# Patient Record
Sex: Female | Born: 1985 | Race: White | Hispanic: No | Marital: Married | Smoking: Former smoker
Health system: Southern US, Community
[De-identification: ages and names within clinical notes are randomized; demographics above are authoritative.]

## PROBLEM LIST (undated history)

## (undated) DIAGNOSIS — E7800 Pure hypercholesterolemia, unspecified: Secondary | ICD-10-CM

## (undated) DIAGNOSIS — I10 Essential (primary) hypertension: Secondary | ICD-10-CM

## (undated) DIAGNOSIS — D249 Benign neoplasm of unspecified breast: Secondary | ICD-10-CM

## (undated) DIAGNOSIS — E559 Vitamin D deficiency, unspecified: Secondary | ICD-10-CM

## (undated) DIAGNOSIS — M419 Scoliosis, unspecified: Secondary | ICD-10-CM

## (undated) DIAGNOSIS — N946 Dysmenorrhea, unspecified: Secondary | ICD-10-CM

## (undated) DIAGNOSIS — O1490 Unspecified pre-eclampsia, unspecified trimester: Secondary | ICD-10-CM

## (undated) HISTORY — DX: Pure hypercholesterolemia, unspecified: E78.00

## (undated) HISTORY — DX: Dysmenorrhea, unspecified: N94.6

## (undated) HISTORY — PX: MOUTH SURGERY: SHX715

## (undated) HISTORY — DX: Vitamin D deficiency, unspecified: E55.9

## (undated) HISTORY — DX: Unspecified pre-eclampsia, unspecified trimester: O14.90

## (undated) HISTORY — DX: Benign neoplasm of unspecified breast: D24.9

---
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
## 2003-07-16 ENCOUNTER — Other Ambulatory Visit: Admission: RE | Admit: 2003-07-16 | Discharge: 2003-07-16 | Payer: Self-pay | Admitting: Obstetrics and Gynecology

## 2004-09-08 ENCOUNTER — Other Ambulatory Visit: Admission: RE | Admit: 2004-09-08 | Discharge: 2004-09-08 | Payer: Self-pay | Admitting: Obstetrics and Gynecology

## 2005-10-02 ENCOUNTER — Other Ambulatory Visit: Admission: RE | Admit: 2005-10-02 | Discharge: 2005-10-02 | Payer: Self-pay | Admitting: Obstetrics and Gynecology

## 2006-10-26 ENCOUNTER — Other Ambulatory Visit: Admission: RE | Admit: 2006-10-26 | Discharge: 2006-10-26 | Payer: Self-pay | Admitting: Obstetrics & Gynecology

## 2007-11-04 ENCOUNTER — Other Ambulatory Visit: Admission: RE | Admit: 2007-11-04 | Discharge: 2007-11-04 | Payer: Self-pay | Admitting: Obstetrics and Gynecology

## 2008-12-01 ENCOUNTER — Other Ambulatory Visit: Admission: RE | Admit: 2008-12-01 | Discharge: 2008-12-01 | Payer: Self-pay | Admitting: Obstetrics and Gynecology

## 2010-12-13 ENCOUNTER — Encounter
Admission: RE | Admit: 2010-12-13 | Discharge: 2010-12-13 | Payer: Self-pay | Source: Home / Self Care | Attending: Obstetrics & Gynecology | Admitting: Obstetrics & Gynecology

## 2011-05-11 ENCOUNTER — Other Ambulatory Visit: Payer: Self-pay | Admitting: Obstetrics & Gynecology

## 2011-06-15 ENCOUNTER — Ambulatory Visit
Admission: RE | Admit: 2011-06-15 | Discharge: 2011-06-15 | Disposition: A | Discharge status: Home / Self Care | Payer: BC Managed Care – PPO | Source: Ambulatory Visit | Attending: Obstetrics & Gynecology | Admitting: Obstetrics & Gynecology

## 2011-11-20 ENCOUNTER — Other Ambulatory Visit: Payer: Self-pay | Admitting: Obstetrics & Gynecology

## 2011-11-20 DIAGNOSIS — N63 Unspecified lump in unspecified breast: Secondary | ICD-10-CM

## 2011-12-15 ENCOUNTER — Other Ambulatory Visit: Payer: Self-pay | Admitting: Obstetrics & Gynecology

## 2011-12-15 ENCOUNTER — Ambulatory Visit
Admission: RE | Admit: 2011-12-15 | Discharge: 2011-12-15 | Disposition: A | Discharge status: Home / Self Care | Payer: BC Managed Care – PPO | Source: Ambulatory Visit | Attending: Obstetrics & Gynecology | Admitting: Obstetrics & Gynecology

## 2011-12-15 DIAGNOSIS — N63 Unspecified lump in unspecified breast: Secondary | ICD-10-CM

## 2012-11-13 ENCOUNTER — Other Ambulatory Visit: Payer: Self-pay | Admitting: Obstetrics & Gynecology

## 2012-11-13 DIAGNOSIS — N63 Unspecified lump in unspecified breast: Secondary | ICD-10-CM

## 2012-12-16 ENCOUNTER — Ambulatory Visit
Admission: RE | Admit: 2012-12-16 | Discharge: 2012-12-16 | Disposition: A | Discharge status: Home / Self Care | Payer: BC Managed Care – PPO | Source: Ambulatory Visit | Attending: Obstetrics & Gynecology | Admitting: Obstetrics & Gynecology

## 2012-12-16 DIAGNOSIS — N63 Unspecified lump in unspecified breast: Secondary | ICD-10-CM

## 2013-01-16 LAB — HM PAP SMEAR

## 2014-01-20 ENCOUNTER — Encounter: Payer: Self-pay | Admitting: Nurse Practitioner

## 2014-01-20 ENCOUNTER — Ambulatory Visit (INDEPENDENT_AMBULATORY_CARE_PROVIDER_SITE_OTHER): Payer: BC Managed Care – PPO | Admitting: Nurse Practitioner

## 2014-01-20 VITALS — BP 140/80 | HR 92 | Ht 67.0 in | Wt 123.0 lb

## 2014-01-20 DIAGNOSIS — Z01419 Encounter for gynecological examination (general) (routine) without abnormal findings: Secondary | ICD-10-CM

## 2014-01-20 NOTE — Progress Notes (Signed)
Patient ID: Bethany Campos, female   DOB: 12-21-1985, 28 y.o.   MRN: [id number] 28 y.o. G0P0 Married Caucasian Fe here for annual exam.  Menses at 28 days, some increase in cramps off OCP, flow a little heavier for 2 days. Then no bleeding for 2 days then spotting for a day or two.  Using withdrawal for birth control. Off Nuva Ring for about 6 + months. Same partner for 9 years.  Got married 01/31/13 at [place] retreat in the [place]  Patient's last menstrual period was 12/31/2013.          Sexually active: yes  The current method of family planning is coitus interruptus.    Exercising: yes  Home exercise routine includes yoga and walking. Smoker:  no  Health Maintenance: Pap:  01/16/13, WNL TDaP:  07/09/08 Gardasil:  completed in 2007 Labs: declined    reports that she has never smoked. She has never used smokeless tobacco. She reports that she does not drink alcohol or use illicit drugs.  History reviewed. No pertinent past medical history.  History reviewed. No pertinent past surgical history.  No current outpatient prescriptions on file.   No current facility-administered medications for this visit.    Family History  Problem Relation Age of Onset  . Parkinson's disease Mother   . Diabetes Father     ROS:  Pertinent items are noted in HPI.  Otherwise, a comprehensive ROS was negative.  Exam:   BP 140/80  Pulse 92  Ht 5\' 7"  (1.702 m)  Wt 123 lb (55.792 kg)  BMI 19.26 kg/m2  LMP 12/31/2013 Height: 5\' 7"  (170.2 cm)  Ht Readings from Last 3 Encounters:  01/20/14 5\' 7"  (1.702 m)    General appearance: alert, cooperative and appears stated age Head: Normocephalic, without obvious abnormality, atraumatic Neck: no adenopathy, supple, symmetrical, trachea midline and thyroid normal to inspection and palpation Lungs: clear to auscultation bilaterally Breasts: normal appearance, no masses or tenderness Heart: regular rate and rhythm Abdomen: soft, non-tender; no masses,  no  organomegaly Extremities: extremities normal, atraumatic, no cyanosis or edema Skin: Skin color, texture, turgor normal. No rashes or lesions Lymph nodes: Cervical, supraclavicular, and axillary nodes normal. No abnormal inguinal nodes palpated Neurologic: Grossly normal   Pelvic: External genitalia:  no lesions              Urethra:  normal appearing urethra with no masses, tenderness or lesions              Bartholin's and Skene's: normal                 Vagina: normal appearing vagina with normal color and discharge, no lesions              Cervix: anteverted              Pap taken: no Bimanual Exam:  Uterus:  normal size, contour, position, consistency, mobility, non-tender              Adnexa: no mass, fullness, tenderness               Rectovaginal: Confirms               Anus:  normal sphincter tone, no lesions  A:  Well Woman with normal exam  Withdrawal for birth control  P:   Pap smear as per guidelines  not done  Not planning family until husband gets out of school.  He is looking at interviews all  across [place].  Advise OTC prenatal MVI daily  Counseled on breast self exam, adequate intake of calcium and vitamin D, diet and exercise return annually or prn  An After Visit Summary was printed and given to the patient.

## 2014-01-20 NOTE — Patient Instructions (Signed)

## 2014-01-21 NOTE — Progress Notes (Signed)
Encounter reviewed by Dr. Maybell Misenheimer Silva.  

## 2015-01-25 ENCOUNTER — Ambulatory Visit (INDEPENDENT_AMBULATORY_CARE_PROVIDER_SITE_OTHER): Payer: BC Managed Care – PPO | Admitting: Nurse Practitioner

## 2015-01-25 ENCOUNTER — Encounter: Payer: Self-pay | Admitting: Nurse Practitioner

## 2015-01-25 VITALS — BP 132/60 | HR 100 | Resp 16 | Ht 67.25 in | Wt 120.0 lb

## 2015-01-25 DIAGNOSIS — Z0000 Encounter for general adult medical examination without abnormal findings: Secondary | ICD-10-CM

## 2015-01-25 DIAGNOSIS — Z01419 Encounter for gynecological examination (general) (routine) without abnormal findings: Secondary | ICD-10-CM

## 2015-01-25 LAB — COMPREHENSIVE METABOLIC PANEL
ALK PHOS: 77 U/L (ref 39–117)
ALT: 15 U/L (ref 0–35)
AST: 19 U/L (ref 0–37)
Albumin: 4.8 g/dL (ref 3.5–5.2)
BILIRUBIN TOTAL: 0.5 mg/dL (ref 0.2–1.2)
BUN: 11 mg/dL (ref 6–23)
CHLORIDE: 102 meq/L (ref 96–112)
CO2: 29 mEq/L (ref 19–32)
CREATININE: 0.67 mg/dL (ref 0.50–1.10)
Calcium: 9.7 mg/dL (ref 8.4–10.5)
GLUCOSE: 90 mg/dL (ref 70–99)
POTASSIUM: 3.7 meq/L (ref 3.5–5.3)
SODIUM: 140 meq/L (ref 135–145)
TOTAL PROTEIN: 7.2 g/dL (ref 6.0–8.3)

## 2015-01-25 LAB — LIPID PANEL
CHOL/HDL RATIO: 2.3 ratio
CHOLESTEROL: 209 mg/dL — AB (ref 0–200)
HDL: 89 mg/dL (ref >39)
LDL Cholesterol: 107 mg/dL — ABNORMAL HIGH (ref 0–99)
Triglycerides: 66 mg/dL (ref <150)
VLDL: 13 mg/dL (ref 0–40)

## 2015-01-25 LAB — HEMOGLOBIN, FINGERSTICK: HEMOGLOBIN, FINGERSTICK: 15.5 g/dL (ref 12.0–16.0)

## 2015-01-25 NOTE — Progress Notes (Signed)
Patient ID: Bethany Campos, female   DOB: Apr 16, 1986, 29 y.o.   MRN: [id number] 29 y.o. G0P0 Married Caucasian Fe here for annual exam.  Not considering pregnancy yet.  Menses lasting 4 days. 2 days heavier. Super tampon and changing every 4 hours. Some cramps and relief with OTC NSAID"s.    She is in a Web designer at [employer].  Patient's last menstrual period was 01/19/2015.          Sexually active: Yes.    The current method of family planning is coitus interruptus.    Exercising: Yes.    Home exercise routine includes yoga and walking. Smoker:  no  Health Maintenance: Pap:  01/16/13, negative  TDaP:  07/09/08 Gardasil:  Completed in 2009 Labs:  HB:  15.5   Urine:  Trace RBC on menses    reports that she has never smoked. She has never used smokeless tobacco. She reports that she does not drink alcohol or use illicit drugs.  History reviewed. No pertinent past medical history.  History reviewed. No pertinent past surgical history.  No current outpatient prescriptions on file.   No current facility-administered medications for this visit.    Family History  Problem Relation Age of Onset  . Parkinson's disease Mother   . Diabetes Father     ROS:  Pertinent items are noted in HPI.  Otherwise, a comprehensive ROS was negative.  Exam:   BP 132/60 mmHg  Pulse 100  Resp 16  Ht 5' 7.25" (1.708 m)  Wt 120 lb (54.432 kg)  BMI 18.66 kg/m2  LMP 01/19/2015 Height: 5' 7.25" (170.8 cm) Ht Readings from Last 3 Encounters:  01/25/15 5' 7.25" (1.708 m)  01/20/14 5\' 7"  (1.702 m)    General appearance: alert, cooperative and appears stated age Head: Normocephalic, without obvious abnormality, atraumatic Neck: no adenopathy, supple, symmetrical, trachea midline and thyroid normal to inspection and palpation Lungs: clear to auscultation bilaterally Breasts: normal appearance, no masses or tenderness Heart: regular rate and rhythm Abdomen: soft, non-tender; no masses,  no  organomegaly Extremities: extremities normal, atraumatic, no cyanosis or edema Skin: Skin color, texture, turgor normal. No rashes or lesions Lymph nodes: Cervical, supraclavicular, and axillary nodes normal. No abnormal inguinal nodes palpated Neurologic: Grossly normal   Pelvic: External genitalia:  no lesions              Urethra:  normal appearing urethra with no masses, tenderness or lesions              Bartholin's and Skene's: normal                 Vagina: normal appearing vagina with normal color and discharge, no lesions              Cervix: anteverted              Pap taken: Yes.   Bimanual Exam:  Uterus:  normal size, contour, position, consistency, mobility, non-tender              Adnexa: no mass, fullness, tenderness               Rectovaginal: Confirms               Anus:  normal sphincter tone, no lesions  Chaperone present:  yes  A:  Well Woman with normal exam  Withdrawal for birth control   P:   Reviewed health and wellness pertinent to exam  Pap smear taken today  Follow  with fasting labs  Counseled on breast self exam, adequate intake of calcium and vitamin D, diet and exercise return annually or prn  An After Visit Summary was printed and given to the patient.

## 2015-01-25 NOTE — Patient Instructions (Addendum)

## 2015-01-26 LAB — TSH: TSH: 0.969 u[IU]/mL (ref 0.350–4.500)

## 2015-01-26 NOTE — Progress Notes (Signed)
Encounter reviewed by Dr. Brook Silva.  

## 2015-01-27 ENCOUNTER — Telehealth: Payer: Self-pay | Admitting: *Deleted

## 2015-01-27 NOTE — Telephone Encounter (Signed)
-----   Message from [place] Cage, [place] sent at 01/26/2015  8:13 AM EST ----- Let patein know results of lipid, TSH, CMP.  Recommend that she follow a low cholesterol diet to keep from going up further.

## 2015-01-27 NOTE — Telephone Encounter (Signed)
Pt notified in result note.  Closing encounter. 

## 2015-01-27 NOTE — Telephone Encounter (Signed)
I have attempted to contact this patient by phone with the following results: left message to return call to [place] at [phone] on answering machine (mobile per [facility]).  Advised call was regarding recent labs.  [phone] (Mobile)

## 2015-01-27 NOTE — Telephone Encounter (Signed)
Pt returning call

## 2015-01-27 NOTE — Telephone Encounter (Signed)
I have attempted to contact this patient by phone with the following results: left message to return call to [place] at [phone] on answering machine (mobile per [facility]). Advised call was regarding recent labs and if she does not reach me to ask for [facility] or triage. [phone] (Mobile)

## 2015-01-28 LAB — IPS PAP TEST WITH HPV

## 2015-04-06 ENCOUNTER — Telehealth: Payer: Self-pay | Admitting: Nurse Practitioner

## 2015-04-06 NOTE — Telephone Encounter (Signed)
Left patient a message to call back to reschedule a future appointment that was cancelled by the provider. °

## 2016-01-27 ENCOUNTER — Ambulatory Visit: Payer: BC Managed Care – PPO | Admitting: Nurse Practitioner

## 2016-01-28 ENCOUNTER — Ambulatory Visit: Payer: BC Managed Care – PPO | Admitting: Nurse Practitioner

## 2016-01-31 ENCOUNTER — Ambulatory Visit: Payer: BC Managed Care – PPO | Admitting: Nurse Practitioner

## 2016-02-22 ENCOUNTER — Ambulatory Visit: Payer: BC Managed Care – PPO | Admitting: Nurse Practitioner

## 2016-03-03 ENCOUNTER — Encounter: Payer: Self-pay | Admitting: Nurse Practitioner

## 2016-03-03 ENCOUNTER — Ambulatory Visit (INDEPENDENT_AMBULATORY_CARE_PROVIDER_SITE_OTHER): Payer: BC Managed Care – PPO | Admitting: Nurse Practitioner

## 2016-03-03 VITALS — BP 126/84 | HR 60 | Ht 67.0 in | Wt 122.0 lb

## 2016-03-03 DIAGNOSIS — Z0000 Encounter for general adult medical examination without abnormal findings: Secondary | ICD-10-CM | POA: Diagnosis not present

## 2016-03-03 DIAGNOSIS — Z01419 Encounter for gynecological examination (general) (routine) without abnormal findings: Secondary | ICD-10-CM

## 2016-03-03 DIAGNOSIS — E7800 Pure hypercholesterolemia, unspecified: Secondary | ICD-10-CM

## 2016-03-03 LAB — CBC WITH DIFFERENTIAL/PLATELET
BASOS ABS: 0.1 10*3/uL (ref 0.0–0.1)
BASOS PCT: 1 % (ref 0–1)
EOS ABS: 0.1 10*3/uL (ref 0.0–0.7)
Eosinophils Relative: 1 % (ref 0–5)
HCT: 45 % (ref 36.0–46.0)
Hemoglobin: 15.1 g/dL — ABNORMAL HIGH (ref 12.0–15.0)
Lymphocytes Relative: 26 % (ref 12–46)
Lymphs Abs: 2.2 10*3/uL (ref 0.7–4.0)
MCH: 29.7 pg (ref 26.0–34.0)
MCHC: 33.6 g/dL (ref 30.0–36.0)
MCV: 88.6 fL (ref 78.0–100.0)
MPV: 9.5 fL (ref 8.6–12.4)
Monocytes Absolute: 0.8 10*3/uL (ref 0.1–1.0)
Monocytes Relative: 9 % (ref 3–12)
NEUTROS PCT: 63 % (ref 43–77)
Neutro Abs: 5.3 10*3/uL (ref 1.7–7.7)
PLATELETS: 242 10*3/uL (ref 150–400)
RBC: 5.08 MIL/uL (ref 3.87–5.11)
RDW: 13.6 % (ref 11.5–15.5)
WBC: 8.4 10*3/uL (ref 4.0–10.5)

## 2016-03-03 LAB — LIPID PANEL
Cholesterol: 201 mg/dL — ABNORMAL HIGH (ref 125–200)
HDL: 86 mg/dL (ref >=46)
LDL Cholesterol: 103 mg/dL (ref <130)
Total CHOL/HDL Ratio: 2.3 Ratio (ref <=5.0)
Triglycerides: 59 mg/dL (ref <150)
VLDL: 12 mg/dL (ref <30)

## 2016-03-03 LAB — TSH: TSH: 0.49 m[IU]/L

## 2016-03-03 NOTE — Progress Notes (Signed)
Patient ID: Bethany Campos, female   DOB: 1986/10/14, 30 y.o.   MRN: [id number]  30 y.o. G0P0000 Married Caucasian Fe here for annual exam.  Menses is heavy for 2 days using a super tampon and changing every 4-6 hrs.  Usually last 4 days. Some cramps and takes Aleve with help.  Not planning pregnancy at this time. 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  Patient's last menstrual period was 02/07/2016 (exact date).          Sexually active: Yes.    The current method of family planning is coitus interruptus.    Exercising: Yes.    walking, hiking and yoga Smoker:  no  Health Maintenance: Pap: 01/25/15, Negative with neg HR HPV TDaP:  07/09/08 Gardasil:  completed in 2009 HIV: done 10/02/2005 Labs: HB: 15.1  Urine: Trace RBC (menses starting)   reports that she has never smoked. She has never used smokeless tobacco. She reports that she does not drink alcohol or use illicit drugs.  Past Medical History  Diagnosis Date  . Dysmenorrhea   . 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 Mouth surgery  age 14    wisdom teeth upper age 45, lower age 23    No current outpatient prescriptions on file.   No current facility-administered medications for this visit.    Family History  Problem Relation Age of Onset  . Parkinson's disease Mother   . Diabetes Father   . Breast cancer Paternal Aunt 45    bilateral mastectomy - BRCA test unknown  . Heart failure Paternal Grandfather     ROS:  Pertinent items are noted in HPI.  Otherwise, a comprehensive ROS was negative.  Exam:   BP 126/84 mmHg  Pulse 60  Ht 5\' 7"  (1.702 m)  Wt 122 lb (55.339 kg)  BMI 19.10 kg/m2  LMP 02/07/2016 (Exact Date) Height: 5\' 7"  (170.2 cm) Ht Readings from Last 3 Encounters:  03/03/16 5\' 7"  (1.702 m)  01/25/15 5' 7.25" (1.708 m)  01/20/14 5\' 7"  (1.702 m)    General appearance: alert, cooperative and  appears stated age Head: Normocephalic, without obvious abnormality, atraumatic Neck: no adenopathy, supple, symmetrical, trachea midline and thyroid normal to inspection and palpation Lungs: clear to auscultation bilaterally Breasts: normal appearance, no masses or tenderness Heart: regular rate and rhythm Abdomen: soft, non-tender; no masses,  no organomegaly Extremities: extremities normal, atraumatic, no cyanosis or edema Skin: Skin color, texture, turgor normal. No rashes or lesions Lymph nodes: Cervical, supraclavicular, and axillary nodes normal. No abnormal inguinal nodes palpated Neurologic: Grossly normal   Pelvic: External genitalia:  no lesions              Urethra:  normal appearing urethra with no masses, tenderness or lesions              Bartholin's and Skene's: normal                 Vagina: normal appearing vagina with normal color and discharge, no lesions              Cervix: anteverted              Pap taken: No. Bimanual Exam:  Uterus:  normal size, contour, position, consistency, mobility, non-tender              Adnexa: no mass,  fullness, tenderness               Rectovaginal: Confirms               Anus:  normal sphincter tone, no lesions  Chaperone present: no  A:  Well Woman with normal exam  Withdrawal for birth control and happy with choice  History of elevated cholesterol   P:   Reviewed health and wellness pertinent to exam  Pap smear as above  Will follow with labs  Counseled on breast self exam, adequate intake of calcium and vitamin D, diet and exercise return annually or prn  An After Visit Summary was printed and given to the patient.

## 2016-03-03 NOTE — Patient Instructions (Signed)

## 2016-03-04 LAB — VITAMIN D 25 HYDROXY (VIT D DEFICIENCY, FRACTURES): VIT D 25 HYDROXY: 18 ng/mL — AB (ref 30–100)

## 2016-03-05 NOTE — Progress Notes (Signed)
Encounter reviewed by Dr. Brook Amundson C. Silva.  

## 2016-03-06 ENCOUNTER — Other Ambulatory Visit: Payer: Self-pay | Admitting: Nurse Practitioner

## 2016-03-06 MED ORDER — VITAMIN D (ERGOCALCIFEROL) 1.25 MG (50000 UNIT) PO CAPS
50000.0000 [IU] | ORAL_CAPSULE | ORAL | Status: DC
Start: 2016-03-06 — End: 2018-03-13

## 2016-06-12 ENCOUNTER — Encounter: Payer: Self-pay | Admitting: *Deleted

## 2017-03-12 ENCOUNTER — Ambulatory Visit (INDEPENDENT_AMBULATORY_CARE_PROVIDER_SITE_OTHER): Payer: BC Managed Care – PPO | Admitting: Nurse Practitioner

## 2017-03-12 ENCOUNTER — Encounter: Payer: Self-pay | Admitting: Nurse Practitioner

## 2017-03-12 VITALS — BP 122/68 | HR 104 | Resp 16 | Ht 67.25 in | Wt 126.0 lb

## 2017-03-12 DIAGNOSIS — Z23 Encounter for immunization: Secondary | ICD-10-CM

## 2017-03-12 DIAGNOSIS — Z01419 Encounter for gynecological examination (general) (routine) without abnormal findings: Secondary | ICD-10-CM

## 2017-03-12 DIAGNOSIS — E559 Vitamin D deficiency, unspecified: Secondary | ICD-10-CM | POA: Diagnosis not present

## 2017-03-12 DIAGNOSIS — Z0000 Encounter for general adult medical examination without abnormal findings: Secondary | ICD-10-CM

## 2017-03-12 DIAGNOSIS — E7800 Pure hypercholesterolemia, unspecified: Secondary | ICD-10-CM

## 2017-03-12 LAB — LIPID PANEL
Cholesterol: 212 mg/dL — ABNORMAL HIGH (ref <200)
HDL: 87 mg/dL (ref >50)
LDL CALC: 113 mg/dL — AB (ref <100)
TRIGLYCERIDES: 62 mg/dL (ref <150)
Total CHOL/HDL Ratio: 2.4 Ratio (ref <5.0)
VLDL: 12 mg/dL (ref <30)

## 2017-03-12 LAB — CBC
HEMATOCRIT: 45.6 % — AB (ref 35.0–45.0)
Hemoglobin: 15.4 g/dL (ref 11.7–15.5)
MCH: 29.8 pg (ref 27.0–33.0)
MCHC: 33.8 g/dL (ref 32.0–36.0)
MCV: 88.4 fL (ref 80.0–100.0)
MPV: 9.9 fL (ref 7.5–12.5)
PLATELETS: 258 10*3/uL (ref 140–400)
RBC: 5.16 MIL/uL — ABNORMAL HIGH (ref 3.80–5.10)
RDW: 13.7 % (ref 11.0–15.0)
WBC: 9.3 10*3/uL (ref 3.8–10.8)

## 2017-03-12 LAB — TSH: TSH: 1.05 m[IU]/L

## 2017-03-12 NOTE — Progress Notes (Signed)
31 y.o. G0P0000 Married  Caucasian Fe here for annual exam.  menses is usually 28 days.  Flow for 5 days, heavier for 2 days.  May consider a pregnancy later this year.  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  Patient's last menstrual period was 02/21/2017.          Sexually active: Yes.    The current method of family planning is coitus interruptus.    Exercising: Yes.    yoga/walking Smoker:  no  Health Maintenance: Pap:  01/25/15, Negative with neg HR HPV           01/16/13, negative  TDaP:  07/09/08 Gardasil: completed in 2009 HIV: done 10/02/05 Labs: labs drawn today   reports that she has never smoked. She has never used smokeless tobacco. She reports that she does not drink alcohol or use drugs.  Past Medical History:  Diagnosis Date  . Dysmenorrhea   . Fibroadenoma of breast 2011 - 2013 [place]     right breast 10 o'clock postion    Past Surgical History:  Procedure Laterality Date  . MOUTH SURGERY  age 65   wisdom teeth upper age 81, lower age 63    Current Outpatient Prescriptions  Medication Sig Dispense Refill  . cholecalciferol (VITAMIN D) 1000 units tablet Take 1,000 Units by mouth every other day.    . Vitamin D, Ergocalciferol, (DRISDOL) 50000 units CAPS capsule Take 1 capsule (50,000 Units total) by mouth every 7 (seven) days. (Patient not taking: Reported on 03/12/2017) 30 capsule 1   No current facility-administered medications for this visit.     Family History  Problem Relation Age of Onset  . Parkinson's disease Mother   . Diabetes Father   . Breast cancer Paternal Aunt 41    bilateral mastectomy - BRCA test unknown  . Heart failure Paternal Grandfather     ROS:  Pertinent items are noted in HPI.  Otherwise, a comprehensive ROS was negative.  Exam:   BP 122/68 (BP Location: Right Arm, Patient Position: Sitting, Cuff Size: Normal)   Pulse (!) 104   Resp 16   Ht 5' 7.25" (1.708 m)    Wt 126 lb (57.2 kg)   LMP 02/21/2017   BMI 19.59 kg/m  Height: 5' 7.25" (170.8 cm) Ht Readings from Last 3 Encounters:  03/12/17 5' 7.25" (1.708 m)  03/03/16 _0  (1.702 m)  01/25/15 5' 7.25" (1.708 m)    General appearance: alert, cooperative and appears stated age Head: Normocephalic, without obvious abnormality, atraumatic Neck: no adenopathy, supple, symmetrical, trachea midline and thyroid normal to inspection and palpation Lungs: clear to auscultation bilaterally Breasts: normal appearance, no masses or tenderness Heart: regular rate and rhythm Abdomen: soft, non-tender; no masses,  no organomegaly Extremities: extremities normal, atraumatic, no cyanosis or edema Skin: Skin color, texture, turgor normal. No rashes or lesions Lymph nodes: Cervical, supraclavicular, and axillary nodes normal. No abnormal inguinal nodes palpated Neurologic: Grossly normal   Pelvic: External genitalia:  no lesions              Urethra:  normal appearing urethra with no masses, tenderness or lesions              Bartholin's and Skene's: normal                 Vagina: normal appearing vagina with normal color and discharge, no lesions  Cervix: anteverted              Pap taken: No. Bimanual Exam:  Uterus:  normal size, contour, position, consistency, mobility, non-tender              Adnexa: no mass, fullness, tenderness               Rectovaginal: Confirms               Anus:  normal sphincter tone, no lesions  Chaperone present: yes  A:  Well Woman with normal exam  Withdrawal for birth control and happy with choice  May consider a pregnancy later this year             History of elevated cholesterol  History of Vit D deficiency  Update immunization   P:   Reviewed health and wellness pertinent to exam  Pap smear not done  Recommend pre natal MVI abotu 3 months prior to starting for a pregnancy  Will follow with labs - currently on OTC Vit D  TDaP is given  today.  Counseled on breast self exam, adequate intake of calcium and vitamin D, diet and exercise return annually or prn  An After Visit Summary was printed and given to the patient.

## 2017-03-12 NOTE — Progress Notes (Signed)
Encounter reviewed by Dr. Brook Amundson C. Silva.  

## 2017-03-12 NOTE — Patient Instructions (Signed)

## 2017-03-13 LAB — VITAMIN D 25 HYDROXY (VIT D DEFICIENCY, FRACTURES): Vit D, 25-Hydroxy: 28 ng/mL — ABNORMAL LOW (ref 30–100)

## 2018-03-13 ENCOUNTER — Ambulatory Visit: Payer: BC Managed Care – PPO | Admitting: Obstetrics and Gynecology

## 2018-03-13 ENCOUNTER — Other Ambulatory Visit: Payer: Self-pay

## 2018-03-13 ENCOUNTER — Other Ambulatory Visit (HOSPITAL_COMMUNITY)
Admission: RE | Admit: 2018-03-13 | Discharge: 2018-03-13 | Disposition: A | Discharge status: Home / Self Care | Payer: BC Managed Care – PPO | Source: Ambulatory Visit | Attending: Obstetrics and Gynecology | Admitting: Obstetrics and Gynecology

## 2018-03-13 ENCOUNTER — Encounter: Payer: Self-pay | Admitting: Obstetrics and Gynecology

## 2018-03-13 ENCOUNTER — Ambulatory Visit: Payer: BC Managed Care – PPO | Admitting: Nurse Practitioner

## 2018-03-13 VITALS — BP 132/74 | HR 88 | Resp 14 | Ht 67.25 in | Wt 127.0 lb

## 2018-03-13 DIAGNOSIS — Z0000 Encounter for general adult medical examination without abnormal findings: Secondary | ICD-10-CM | POA: Diagnosis not present

## 2018-03-13 DIAGNOSIS — E559 Vitamin D deficiency, unspecified: Secondary | ICD-10-CM

## 2018-03-13 DIAGNOSIS — Z124 Encounter for screening for malignant neoplasm of cervix: Secondary | ICD-10-CM | POA: Diagnosis not present

## 2018-03-13 DIAGNOSIS — Z3169 Encounter for other general counseling and advice on procreation: Secondary | ICD-10-CM

## 2018-03-13 DIAGNOSIS — Z01419 Encounter for gynecological examination (general) (routine) without abnormal findings: Secondary | ICD-10-CM

## 2018-03-13 NOTE — Progress Notes (Signed)
32 y.o. G0P0000 MarriedCaucasianF here for annual exam.  Planning pregnancy likely later this year, on PNV Cycles q 24-29 days. Using W/D for contraception.  Period Cycle (Days): 21 Period Duration (Days): 3-4 days  Period Pattern: Regular Menstrual Flow: Moderate, Heavy Menstrual Control: Tampon Menstrual Control Change Freq (Hours): changes tampon every 6-8 hours  Dysmenorrhea: (!) Mild Dysmenorrhea Symptoms: Cramping  Patient's last menstrual period was 03/04/2018.          Sexually active: Yes.    The current method of family planning is none.    Exercising: No.  The patient does not participate in regular exercise at present. Smoker:  no  Health Maintenance: Pap:  01-25-15 WNL NEG HR HPV  History of abnormal Pap:  no TDaP:  03-12-17 Gardasil: completed all 3    reports that she has never smoked. She has never used smokeless tobacco. She reports that she does not drink alcohol or use drugs. Rare ETOH. 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  Past Medical History:  Diagnosis Date  . Dysmenorrhea   . Elevated cholesterol   . 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 Vitamin D deficiency     Past Surgical History:  Procedure Laterality Date  . MOUTH SURGERY  age 76   wisdom teeth upper age 3, lower age 43    No current outpatient medications on file.   No current facility-administered medications for this visit.     Family History  Problem Relation Age of Onset  . Parkinson's disease Mother   . Diabetes Father   . Breast cancer Paternal Aunt 95       bilateral mastectomy - BRCA test unknown  . Heart failure Paternal Grandfather   77 aunt had the breast cancer, she was in her 62's.   Review of Systems  Constitutional: Negative.   HENT: Negative.   Eyes: Negative.   Respiratory: Negative.   Cardiovascular: Negative.   Gastrointestinal: Negative.    Endocrine: Negative.   Genitourinary: Negative.   Musculoskeletal: Negative.   Skin: Negative.   Allergic/Immunologic: Negative.   Neurological: Negative.   Psychiatric/Behavioral: Negative.     Exam:   BP 132/74 (BP Location: Right Arm, Patient Position: Sitting, Cuff Size: Normal)   Pulse 88   Resp 14   Ht 5' 7.25" (1.708 m)   Wt 127 lb (57.6 kg)   LMP 03/04/2018   BMI 19.74 kg/m   Weight change: '@WEIGHTCHANGE'$ @ Height:   Height: 5' 7.25" (170.8 cm)  Ht Readings from Last 3 Encounters:  03/13/18 5' 7.25" (1.708 m)  03/12/17 5' 7.25" (1.708 m)  03/03/16 '5\' 7"'$  (1.702 m)    General appearance: alert, cooperative and appears stated age Head: Normocephalic, without obvious abnormality, atraumatic Neck: no adenopathy, supple, symmetrical, trachea midline and thyroid normal to inspection and palpation Lungs: clear to auscultation bilaterally Cardiovascular: regular rate and rhythm Breasts: normal appearance, no masses or tenderness Abdomen: soft, non-tender; non distended,  no masses,  no organomegaly Extremities: extremities normal, atraumatic, no cyanosis or edema Skin: Skin color, texture, turgor normal. No rashes or lesions Lymph nodes: Cervical, supraclavicular, and axillary nodes normal. No abnormal inguinal nodes palpated Neurologic: Grossly normal   Pelvic: External genitalia:  no lesions              Urethra:  normal appearing urethra with no masses, tenderness or lesions  Bartholins and Skenes: normal                 Vagina: normal appearing vagina with normal color and discharge, no lesions              Cervix: no lesions               Bimanual Exam:  Uterus:  normal size, contour, position, consistency, mobility, non-tender and anteverted              Adnexa: no mass, fullness, tenderness               Rectovaginal: Confirms               Anus:  normal sphincter tone, no lesions  Chaperone was present for exam.  A:  Well Woman with normal  exam  Preconception counseling   Vit d def  H/O elevated cholesterol  H/O breast fibroadenoma, not felt on exam today. Patient didn't feel it either, states she felt it recently and that it hadn't changed (small)  P:   Pap with hpv  Screening labs, vit D, fasting lipids  Rubella screen  Discussed the option for CF testing  Names of OB's given  Recommended she stay on PNV

## 2018-03-13 NOTE — Patient Instructions (Signed)
EXERCISE AND DIET:  We recommended that you start or continue a regular exercise program for good health. Regular exercise means any activity that makes your heart beat faster and makes you sweat.  We recommend exercising at least 30 minutes per day at least 3 days a week, preferably 4 or 5.  We also recommend a diet low in fat and sugar.  Inactivity, poor dietary choices and obesity can cause diabetes, heart attack, stroke, and kidney damage, among others.    ALCOHOL AND SMOKING:  Women should limit their alcohol intake to no more than 7 drinks/beers/glasses of wine (combined, not each!) per week. Moderation of alcohol intake to this level decreases your risk of breast cancer and liver damage. And of course, no recreational drugs are part of a healthy lifestyle.  And absolutely no smoking or even second hand smoke. Most people know smoking can cause heart and lung diseases, but did you know it also contributes to weakening of your bones? Aging of your skin?  Yellowing of your teeth and nails?  CALCIUM AND VITAMIN D:  Adequate intake of calcium and Vitamin D are recommended.  The recommendations for exact amounts of these supplements seem to change often, but generally speaking 600 mg of calcium (either carbonate or citrate) and 800 units of Vitamin D per day seems prudent. Certain women may benefit from higher intake of Vitamin D.  If you are among these women, your doctor will have told you during your visit.    PAP SMEARS:  Pap smears, to check for cervical cancer or precancers,  have traditionally been done yearly, although recent scientific advances have shown that most women can have pap smears less often.  However, every woman still should have a physical exam from her gynecologist every year. It will include a breast check, inspection of the vulva and vagina to check for abnormal growths or skin changes, a visual exam of the cervix, and then an exam to evaluate the size and shape of the uterus and  ovaries.  And after 34 years of age, a rectal exam is indicated to check for rectal cancers. We will also provide age appropriate advice regarding health maintenance, like when you should have certain vaccines, screening for sexually transmitted diseases, bone density testing, colonoscopy, mammograms, etc.   MAMMOGRAMS:  All women over 40 years old should have a yearly mammogram. Many facilities now offer a "3D" mammogram, which may cost around $50 extra out of pocket. If possible,  we recommend you accept the option to have the 3D mammogram performed.  It both reduces the number of women who will be called back for extra views which then turn out to be normal, and it is better than the routine mammogram at detecting truly abnormal areas.    COLONOSCOPY:  Colonoscopy to screen for colon cancer is recommended for all women at age 50.  We know, you hate the idea of the prep.  We agree, BUT, having colon cancer and not knowing it is worse!!  Colon cancer so often starts as a polyp that can be seen and removed at colonscopy, which can quite literally save your life!  And if your first colonoscopy is normal and you have no family history of colon cancer, most women don't have to have it again for 10 years.  Once every ten years, you can do something that may end up saving your life, right?  We will be happy to help you get it scheduled when you are ready.    Be sure to check your insurance coverage so you understand how much it will cost.  It may be covered as a preventative service at no cost, but you should check your particular policy.      Preparing for Pregnancy If you are considering becoming pregnant, make an appointment to see your regular health care provider to learn how to prepare for a safe and healthy pregnancy (preconception care). During a preconception care visit, your health care provider will:  Do a complete physical exam, including a Pap test.  Take a complete medical history.  Give you  information, answer your questions, and help you resolve problems.  Preconception checklist Medical history  Tell your health care provider about any current or past medical conditions. Your pregnancy or your ability to become pregnant may be affected by chronic conditions, such as diabetes, chronic hypertension, and thyroid problems.  Include your family's medical history as well as your partner's medical history.  Tell your health care provider about any history of STIs (sexually transmitted infections).These can affect your pregnancy. In some cases, they can be passed to your baby. Discuss any concerns that you have about STIs.  If indicated, discuss the benefits of genetic testing. This testing will show whether there are any genetic conditions that may be passed from you or your partner to your baby.  Tell your health care provider about: ? Any problems you have had with conception or pregnancy. ? Any medicines you take. These include vitamins, herbal supplements, and over-the-counter medicines. ? Your history of immunizations. Discuss any vaccinations that you may need.  Diet  Ask your health care provider what to include in a healthy diet that has a balance of nutrients. This is especially important when you are pregnant or preparing to become pregnant.  Ask your health care provider to help you reach a healthy weight before pregnancy. ? If you are overweight, you may be at higher risk for certain complications, such as high blood pressure, diabetes, and preterm birth. ? If you are underweight, you are more likely to have a baby who has a low birth weight.  Lifestyle, work, and home  Let your health care provider know: ? About any lifestyle habits that you have, such as alcohol use, drug use, or smoking. ? About recreational activities that may put you at risk during pregnancy, such as downhill skiing and certain exercise programs. ? Tell your health care provider about any  international travel, especially any travel to places with an active Zika virus outbreak. ? About harmful substances that you may be exposed to at work or at home. These include chemicals, pesticides, radiation, or even litter boxes. ? If you do not feel safe at home.  Mental health  Tell your health care provider about: ? Any history of mental health conditions, including feelings of depression, sadness, or anxiety. ? Any medicines that you take for a mental health condition. These include herbs and supplements.  Home instructions to prepare for pregnancy Lifestyle  Eat a balanced diet. This includes fresh fruits and vegetables, whole grains, lean meats, low-fat dairy products, healthy fats, and foods that are high in fiber. Ask to meet with a nutritionist or registered dietitian for assistance with meal planning and goals.  Get regular exercise. Try to be active for at least 30 minutes a day on most days of the week. Ask your health care provider which activities are safe during pregnancy.  Do not use any products that contain nicotine or tobacco,   such as cigarettes and e-cigarettes. If you need help quitting, ask your health care provider.  Do not drink alcohol.  Do not take illegal drugs.  Maintain a healthy weight. Ask your health care provider what weight range is right for you.  General instructions  Keep an accurate record of your menstrual periods. This makes it easier for your health care provider to determine your baby's due date.  Begin taking prenatal vitamins and folic acid supplements daily as directed by your health care provider.  Manage any chronic conditions, such as high blood pressure and diabetes, as told by your health care provider. This is important.  How do I know that I am pregnant? You may be pregnant if you have been sexually active and you miss your period. Symptoms of early pregnancy include:  Mild cramping.  Very light vaginal bleeding  (spotting).  Feeling unusually tired.  Nausea and vomiting (morning sickness).  If you have any of these symptoms and you suspect that you might be pregnant, you can take a home pregnancy test. These tests check for a hormone in your urine (human chorionic gonadotropin, or hCG). A woman's body begins to make this hormone during early pregnancy. These tests are very accurate. Wait until at least the first day after you miss your period to take one. If the test shows that you are pregnant (you get a positive result), call your health care provider to make an appointment for prenatal care. What should I do if I become pregnant?  Make an appointment with your health care provider as soon as you suspect you are pregnant.  Do not use any products that contain nicotine, such as cigarettes, chewing tobacco, and e-cigarettes. If you need help quitting, ask your health care provider.  Do not drink alcoholic beverages. Alcohol is related to a number of birth defects.  Avoid toxic odors and chemicals.  You may continue to have sexual intercourse if it does not cause pain or other problems, such as vaginal bleeding. This information is not intended to replace advice given to you by your health care provider. Make sure you discuss any questions you have with your health care provider. Document Released: 11/16/2008 Document Revised: 08/01/2016 Document Reviewed: 06/25/2016 Elsevier Interactive Patient Education  2018 [employer]  

## 2018-03-14 LAB — CBC
Hematocrit: 44.1 % (ref 34.0–46.6)
Hemoglobin: 14.5 g/dL (ref 11.1–15.9)
MCH: 29.4 pg (ref 26.6–33.0)
MCHC: 32.9 g/dL (ref 31.5–35.7)
MCV: 89 fL (ref 79–97)
PLATELETS: 338 10*3/uL (ref 150–379)
RBC: 4.94 x10E6/uL (ref 3.77–5.28)
RDW: 13.3 % (ref 12.3–15.4)
WBC: 9.3 10*3/uL (ref 3.4–10.8)

## 2018-03-14 LAB — COMPREHENSIVE METABOLIC PANEL
A/G RATIO: 1.9 (ref 1.2–2.2)
ALT: 15 IU/L (ref 0–32)
AST: 21 IU/L (ref 0–40)
Albumin: 4.5 g/dL (ref 3.5–5.5)
Alkaline Phosphatase: 93 IU/L (ref 39–117)
BUN/Creatinine Ratio: 14 (ref 9–23)
BUN: 10 mg/dL (ref 6–20)
Bilirubin Total: <0.2 mg/dL (ref 0.0–1.2)
CALCIUM: 9.8 mg/dL (ref 8.7–10.2)
CO2: 25 mmol/L (ref 20–29)
CREATININE: 0.7 mg/dL (ref 0.57–1.00)
Chloride: 101 mmol/L (ref 96–106)
GFR, EST AFRICAN AMERICAN: 132 mL/min/{1.73_m2} (ref >59)
GFR, EST NON AFRICAN AMERICAN: 114 mL/min/{1.73_m2} (ref >59)
Globulin, Total: 2.4 g/dL (ref 1.5–4.5)
Glucose: 104 mg/dL — ABNORMAL HIGH (ref 65–99)
Potassium: 4.6 mmol/L (ref 3.5–5.2)
Sodium: 140 mmol/L (ref 134–144)
TOTAL PROTEIN: 6.9 g/dL (ref 6.0–8.5)

## 2018-03-14 LAB — LIPID PANEL
CHOL/HDL RATIO: 2.4 ratio (ref 0.0–4.4)
CHOLESTEROL TOTAL: 187 mg/dL (ref 100–199)
HDL: 78 mg/dL (ref >39)
LDL CALC: 100 mg/dL — AB (ref 0–99)
TRIGLYCERIDES: 46 mg/dL (ref 0–149)
VLDL Cholesterol Cal: 9 mg/dL (ref 5–40)

## 2018-03-14 LAB — VITAMIN D 25 HYDROXY (VIT D DEFICIENCY, FRACTURES): Vit D, 25-Hydroxy: 38.4 ng/mL (ref 30.0–100.0)

## 2018-03-14 LAB — RUBELLA SCREEN: Rubella Antibodies, IGG: 2.58 index (ref >0.99)

## 2018-03-15 LAB — CYTOLOGY - PAP
Diagnosis: NEGATIVE
HPV: NOT DETECTED

## 2018-09-16 ENCOUNTER — Telehealth: Payer: Self-pay | Admitting: Obstetrics and Gynecology

## 2018-09-17 ENCOUNTER — Encounter: Payer: Self-pay | Admitting: Obstetrics and Gynecology

## 2018-09-17 ENCOUNTER — Ambulatory Visit: Payer: BC Managed Care – PPO | Admitting: Obstetrics and Gynecology

## 2018-09-17 ENCOUNTER — Other Ambulatory Visit: Payer: Self-pay

## 2018-09-17 VITALS — BP 130/72 | HR 96 | Wt 125.0 lb

## 2018-09-17 DIAGNOSIS — N926 Irregular menstruation, unspecified: Secondary | ICD-10-CM | POA: Diagnosis not present

## 2018-09-17 LAB — POCT URINE PREGNANCY: Preg Test, Ur: POSITIVE — AB

## 2018-09-17 NOTE — Progress Notes (Signed)
GYNECOLOGY  VISIT   HPI: 32 y.o.   Married White or Caucasian Not Hispanic or Latino  female   G0P0000 with Patient's last menstrual period was 08/17/2018 (exact date).   here for pregnancy confirmation. Took UPT yesterday that was positive. No bleeding, no c/o. No h/o STD's.   GYNECOLOGIC HISTORY: Patient's last menstrual period was 08/17/2018 (exact date).  Contraception:None Menopausal hormone therapy: None        OB History    Gravida  0   Para  0   Term  0   Preterm  0   AB  0   Living  0     SAB  0   TAB  0   Ectopic  0   Multiple  0   Live Births                 There are no active problems to display for this patient.   Past Medical History:  Diagnosis Date  . Dysmenorrhea   . Elevated cholesterol   . 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 Vitamin D deficiency     Past Surgical History:  Procedure Laterality Date  . MOUTH SURGERY  age 49   wisdom teeth upper age 89, lower age 16    No current outpatient medications on file.   No current facility-administered medications for this visit.      ALLERGIES: Patient has no known allergies.  Family History  Problem Relation Age of Onset  . Parkinson's disease Mother   . Diabetes Father   . Breast cancer Paternal Aunt 83       bilateral mastectomy - BRCA test unknown  . Heart failure Paternal Grandfather     Social History   Socioeconomic History  . Marital status: Married    Spouse name: Not on file  . Number of children: Not on file  . Years of education: Not on file  . Highest education level: Not on file  Occupational History  . Not on file  Social Needs  . Financial resource strain: Not on file  . Food insecurity:    Worry: Not on file    Inability: Not on file  . Transportation needs:    Medical: Not on file    Non-medical: Not on file  Tobacco Use  . Smoking status: Never Smoker  . Smokeless tobacco: Never Used  Substance and  Sexual Activity  . Alcohol use: No  . Drug use: No  . Sexual activity: Yes    Partners: Male    Birth control/protection: Coitus interruptus  Lifestyle  . Physical activity:    Days per week: Not on file    Minutes per session: Not on file  . Stress: Not on file  Relationships  . Social connections:    Talks on phone: Not on file    Gets together: Not on file    Attends religious service: Not on file    Active member of club or organization: Not on file    Attends meetings of clubs or organizations: Not on file    Relationship status: Not on file  . Intimate partner violence:    Fear of current or ex partner: Not on file    Emotionally abused: Not on file    Physically abused: Not on file    Forced sexual activity: Not on file  Other Topics Concern  . Not on file  Social  History Narrative  . Not on file    Review of Systems  Constitutional: Negative.   HENT: Negative.   Eyes: Negative.   Respiratory: Negative.   Cardiovascular: Negative.   Gastrointestinal: Negative.   Endocrine: Negative.   Genitourinary: Negative.   Musculoskeletal: Negative.   Skin: Negative.   Allergic/Immunologic: Negative.   Neurological: Negative.   Hematological: Negative.   Psychiatric/Behavioral: Negative.     PHYSICAL EXAMINATION:    BP 130/72 (BP Location: Right Arm, Patient Position: Sitting, Cuff Size: Normal)   Pulse 96   Wt 125 lb (56.7 kg)   LMP 08/17/2018 (Exact Date)   BMI 19.43 kg/m     General appearance: alert, cooperative and appears stated age  ASSESSMENT +UPT, no ectopic risk factors    PLAN Discussed good health in pregnancy Discussed avoiding ETOH, medication Discussed exercise Names of OB providers given   An After Visit Summary was printed and given to the patient.

## 2018-09-26 ENCOUNTER — Ambulatory Visit (INDEPENDENT_AMBULATORY_CARE_PROVIDER_SITE_OTHER): Payer: BC Managed Care – PPO

## 2018-09-26 ENCOUNTER — Telehealth: Payer: Self-pay | Admitting: Emergency Medicine

## 2018-09-26 ENCOUNTER — Other Ambulatory Visit (HOSPITAL_COMMUNITY)
Admission: AD | Admit: 2018-09-26 | Discharge: 2018-09-26 | Disposition: A | Discharge status: Home / Self Care | Payer: BC Managed Care – PPO | Source: Ambulatory Visit | Attending: Obstetrics and Gynecology | Admitting: Obstetrics and Gynecology

## 2018-09-26 ENCOUNTER — Other Ambulatory Visit: Payer: Self-pay

## 2018-09-26 ENCOUNTER — Ambulatory Visit (INDEPENDENT_AMBULATORY_CARE_PROVIDER_SITE_OTHER): Payer: BC Managed Care – PPO | Admitting: Obstetrics and Gynecology

## 2018-09-26 ENCOUNTER — Encounter: Payer: Self-pay | Admitting: Obstetrics and Gynecology

## 2018-09-26 ENCOUNTER — Telehealth: Payer: Self-pay | Admitting: Obstetrics and Gynecology

## 2018-09-26 VITALS — BP 126/84 | HR 80 | Wt 123.0 lb

## 2018-09-26 DIAGNOSIS — O208 Other hemorrhage in early pregnancy: Secondary | ICD-10-CM

## 2018-09-26 DIAGNOSIS — O209 Hemorrhage in early pregnancy, unspecified: Secondary | ICD-10-CM

## 2018-09-26 DIAGNOSIS — Z3A00 Weeks of gestation of pregnancy not specified: Secondary | ICD-10-CM | POA: Diagnosis not present

## 2018-09-26 DIAGNOSIS — R102 Pelvic and perineal pain: Secondary | ICD-10-CM

## 2018-09-26 LAB — HCG, QUANTITATIVE, PREGNANCY: HCG, BETA CHAIN, QUANT, S: 201 m[IU]/mL — AB (ref <5)

## 2018-09-26 MED ORDER — RHO D IMMUNE GLOBULIN 1500 UNITS IM SOSY: 1.0000 | PREFILLED_SYRINGE | Freq: Once | INTRAMUSCULAR | 0 refills | Status: DC

## 2018-09-26 NOTE — Telephone Encounter (Signed)
Call to patient and advised of HCG consistent with early pregnancy loss and blood type O negative.   Pt aware needs to go to Maternal Admissions Unit at [facility] tomorrow, mid afternoon for stat HCG ordered and one Rhogam injection.  Order will be faxed to Maternal Admissions Unit at [place] agreeable with plan and verbalizes understanding of all instructions.  She is aware to call back with any questions or concerns.    update to Dr. Talbert Nan and cc Dr. Sabra Heck

## 2018-09-26 NOTE — Telephone Encounter (Signed)
[place] with patient: G1 P0  [redacted]w[redacted]d per LMP Woke up this morning with feelings of intermittent pelvic cramps, "feels like period cramps." Pain in mid abdomen. Pain is intermittent and rates 5/10 when it occurs. No OTC treatment.  Vaginal bleeding started this morning as well, noted blood on tissue with voiding, getting a little bit heavier with each time she uses the restroom. Now wearing a panty liner. No heavy bleeding. No dizziness or weakness. No recent trauma or intercourse.  Advised will review with Dr. Talbert Nan and return call.   Reviewed with Dr. Talbert Nan, orders received for OB ultrasound in office today if pain/bleeding do not increase, if pain or bleeding increase patient to go straight to Maternal Admissions Unit at [facility]. .   1150: Returned call to patient. Plan of care discussed. Pt agreeable to plan. She is with her parents and someone will drive her to her appointment. Strict precautions given to go straight to Maternal Admissions Unit at [facility] if bleeding increases or pain increases.  Bleeding emergency precautions given and patient verbalized understanding of all instructions and knows to go to Maternal Admissions Unit at [facility] with any increase of symptoms, otherwise, will have appointment for ultrasound and follow up here with Dr. Talbert Nan today at 1330.  To MD to review and will close encounter.

## 2018-09-26 NOTE — Progress Notes (Signed)
GYNECOLOGY  VISIT   HPI: 32 y.o.   Married White or Caucasian Not Hispanic or Latino  female   G0P0000 with Patient's last menstrual period was 08/17/2018 (exact date).   here for  Consult after PUS. The patient is 5+4 days based on a definite 29 day cycle who presents c/o intermittent midline lower abdominal cramping this morning. The cramping was in waves, up to a 5/10 in severity. Currently feeling better. Slight discomfort on the left with the ultrasound. She is bleeding like the beginning of a menses in the last 3 hours, still wearing the same pad. More blood in the toilet with some clots a few hours ago.  No h/o STD's  GYNECOLOGIC HISTORY: Patient's last menstrual period was 08/17/2018 (exact date). Contraception: Coitus interruptus Menopausal hormone therapy: None        OB History    Gravida  0   Para  0   Term  0   Preterm  0   AB  0   Living  0     SAB  0   TAB  0   Ectopic  0   Multiple  0   Live Births                 There are no active problems to display for this patient.   Past Medical History:  Diagnosis Date  . Dysmenorrhea   . Elevated cholesterol   . 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 Vitamin D deficiency     Past Surgical History:  Procedure Laterality Date  . MOUTH SURGERY  age 39   wisdom teeth upper age 12, lower age 59    No current outpatient medications on file.   No current facility-administered medications for this visit.      ALLERGIES: Patient has no known allergies.  Family History  Problem Relation Age of Onset  . Parkinson's disease Mother   . Diabetes Father   . Breast cancer Paternal Aunt 40       bilateral mastectomy - BRCA test unknown  . Heart failure Paternal Grandfather     Social History   Socioeconomic History  . Marital status: Married    Spouse name: Not on file  . Number of children: Not on file  . Years of education: Not on file  . Highest  education level: Not on file  Occupational History  . Not on file  Social Needs  . Financial resource strain: Not on file  . Food insecurity:    Worry: Not on file    Inability: Not on file  . Transportation needs:    Medical: Not on file    Non-medical: Not on file  Tobacco Use  . Smoking status: Never Smoker  . Smokeless tobacco: Never Used  Substance and Sexual Activity  . Alcohol use: No  . Drug use: No  . Sexual activity: Yes    Partners: Male    Birth control/protection: Coitus interruptus  Lifestyle  . Physical activity:    Days per week: Not on file    Minutes per session: Not on file  . Stress: Not on file  Relationships  . Social connections:    Talks on phone: Not on file    Gets together: Not on file    Attends religious service: Not on file    Active member of club or organization: Not on file    Attends meetings  of clubs or organizations: Not on file    Relationship status: Not on file  . Intimate partner violence:    Fear of current or ex partner: Not on file    Emotionally abused: Not on file    Physically abused: Not on file    Forced sexual activity: Not on file  Other Topics Concern  . Not on file  Social History Narrative  . Not on file    Review of Systems  Constitutional: Negative.   HENT: Negative.   Eyes: Negative.   Respiratory: Negative.   Cardiovascular: Negative.   Gastrointestinal: Negative.   Genitourinary:       Unscheduled bleeding  Musculoskeletal: Negative.   Skin: Negative.   Neurological: Negative.   Endo/Heme/Allergies: Negative.   Psychiatric/Behavioral: Negative.     PHYSICAL EXAMINATION:    BP 126/84 (BP Location: Right Arm, Patient Position: Sitting, Cuff Size: Normal)   Pulse 80   Wt 123 lb (55.8 kg)   LMP 08/17/2018 (Exact Date)   BMI 19.12 kg/m     General appearance: alert, cooperative and appears stated age Abdomen: soft, non-tender; non distended, no masses,  no organomegaly  Pelvic: External  genitalia:  no lesions              Urethra:  normal appearing urethra with no masses, tenderness or lesions              Bartholins and Skenes: normal                 Vagina: normal appearing vagina with normal color and discharge, no lesions              Cervix: no lesions and not dilated There is a moderate amount of blood in her vagina with ? Placental tissue.               Bimanual Exam:  Uterus:  normal size, contour, position, consistency, mobility, non-tender and anteverted              Adnexa: no mass, fullness, tenderness                Chaperone was present for exam.  ASSESSMENT First trimester bleeding, no IUP on ultrasound, small CL, no signs of ectopic.     PLAN Stat [facility] T&S Call with heavy vaginally bleeding or worsening    An After Visit Summary was printed and given to the patient.  ~15 minutes face to face time of which over 50% was spent in counseling.    Addendum: [facility] 201 O- She will go to [facility] tomorrow have a f/u [facility] and get Rhogam. Dr Sabra Heck will be informed of her f/u [facility]  CC; Dr Sabra Heck

## 2018-09-26 NOTE — Telephone Encounter (Signed)
Patient is [redacted] weeks pregnant and has had a lot of bleeding this morning. Does not have an OB yet.

## 2018-09-27 ENCOUNTER — Inpatient Hospital Stay (HOSPITAL_COMMUNITY)
Admission: AD | Admit: 2018-09-27 | Discharge: 2018-09-27 | Disposition: A | Discharge status: Home / Self Care | Payer: BC Managed Care – PPO | Source: Ambulatory Visit | Attending: Obstetrics and Gynecology | Admitting: Obstetrics and Gynecology

## 2018-09-27 ENCOUNTER — Telehealth: Payer: Self-pay | Admitting: Emergency Medicine

## 2018-09-27 DIAGNOSIS — Z3A01 Less than 8 weeks gestation of pregnancy: Secondary | ICD-10-CM | POA: Insufficient documentation

## 2018-09-27 DIAGNOSIS — Z6791 Unspecified blood type, Rh negative: Secondary | ICD-10-CM | POA: Insufficient documentation

## 2018-09-27 DIAGNOSIS — O209 Hemorrhage in early pregnancy, unspecified: Secondary | ICD-10-CM | POA: Diagnosis present

## 2018-09-27 DIAGNOSIS — O26891 Other specified pregnancy related conditions, first trimester: Secondary | ICD-10-CM | POA: Diagnosis not present

## 2018-09-27 LAB — TYPE AND SCREEN
ABO/RH(D): O NEG
ANTIBODY SCREEN: NEGATIVE

## 2018-09-27 LAB — HCG, QUANTITATIVE, PREGNANCY: hCG, Beta Chain, Quant, S: 103 m[IU]/mL — ABNORMAL HIGH (ref <5)

## 2018-09-27 MED ORDER — RHO D IMMUNE GLOBULIN 1500 UNIT/2ML IJ SOSY
300.0000 ug | PREFILLED_SYRINGE | Freq: Once | INTRAMUSCULAR | Status: AC
  Administered 2018-09-27: 300 ug via INTRAMUSCULAR
  Filled 2018-09-27: qty 2

## 2018-09-27 NOTE — Telephone Encounter (Signed)
Call to patient and advised of  quantitative beta hcg  For today 09/27/18-103 down from 201 yesterday. Pt relieved for this.   She is waiting now for her rhogam injection at Maternal Admissions Unit at [facility].    She is advised that I have reviewed with Dr. Sabra Heck and needs follow up appointment on Monday with Dr. Talbert Nan for repeat blood work (to be done here in our office).   Pt agreeable. Appointment made.  She is aware of bleeding/pain precautions and instructions. States she feels well today.  Routing to Dr. Sabra Heck and Dr. Talbert Nan.   Will close encounter.

## 2018-09-27 NOTE — [facility] Note (Signed)
Pt here for Rhophylac administration. Call to office prior to pt leaving and they have already reviewed her labs and spoken with pt

## 2018-09-27 NOTE — Addendum Note (Signed)
Addended by: Michele Mcalpine on: 09/27/2018 08:37 AM   Modules accepted: Orders

## 2018-09-28 LAB — RH IG WORKUP (INCLUDES ABO/RH)
ABO/RH(D): O NEG
Antibody Screen: NEGATIVE
GESTATIONAL AGE(WKS): 5
Unit division: 0

## 2018-09-30 ENCOUNTER — Encounter: Payer: Self-pay | Admitting: Obstetrics and Gynecology

## 2018-09-30 ENCOUNTER — Ambulatory Visit: Payer: BC Managed Care – PPO | Admitting: Obstetrics and Gynecology

## 2018-09-30 ENCOUNTER — Other Ambulatory Visit: Payer: Self-pay

## 2018-09-30 VITALS — BP 118/82 | HR 72 | Wt 123.8 lb

## 2018-09-30 DIAGNOSIS — O209 Hemorrhage in early pregnancy, unspecified: Secondary | ICD-10-CM

## 2018-09-30 NOTE — Progress Notes (Signed)
GYNECOLOGY  VISIT   HPI: 32 y.o.   Married White or Caucasian Not Hispanic or Latino  female   G0P0000 with No LMP recorded.   here for follow up from first trimester bleeding. BhcG was dropping last week. She got Rhogam last week. She is feeling better, bleeding has mostly stopped, only spotting. No pain.   GYNECOLOGIC HISTORY: No LMP recorded. Contraception: Coitus interruptus  Menopausal hormone therapy: None        OB History    Gravida  0   Para  0   Term  0   Preterm  0   AB  0   Living  0     SAB  0   TAB  0   Ectopic  0   Multiple  0   Live Births                 There are no active problems to display for this patient.   Past Medical History:  Diagnosis Date  . Dysmenorrhea   . Elevated cholesterol   . 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 Vitamin D deficiency     Past Surgical History:  Procedure Laterality Date  . MOUTH SURGERY  age 74   wisdom teeth upper age 28, lower age 49    No current outpatient medications on file.   No current facility-administered medications for this visit.      ALLERGIES: Patient has no known allergies.  Family History  Problem Relation Age of Onset  . Parkinson's disease Mother   . Diabetes Father   . Breast cancer Paternal Aunt 65       bilateral mastectomy - BRCA test unknown  . Heart failure Paternal Grandfather     Social History   Socioeconomic History  . Marital status: Married    Spouse name: Not on file  . Number of children: Not on file  . Years of education: Not on file  . Highest education level: Not on file  Occupational History  . Not on file  Social Needs  . Financial resource strain: Not on file  . Food insecurity:    Worry: Not on file    Inability: Not on file  . Transportation needs:    Medical: Not on file    Non-medical: Not on file  Tobacco Use  . Smoking status: Never Smoker  . Smokeless tobacco: Never Used  Substance and  Sexual Activity  . Alcohol use: No  . Drug use: No  . Sexual activity: Yes    Partners: Male    Birth control/protection: Coitus interruptus  Lifestyle  . Physical activity:    Days per week: Not on file    Minutes per session: Not on file  . Stress: Not on file  Relationships  . Social connections:    Talks on phone: Not on file    Gets together: Not on file    Attends religious service: Not on file    Active member of club or organization: Not on file    Attends meetings of clubs or organizations: Not on file    Relationship status: Not on file  . Intimate partner violence:    Fear of current or ex partner: Not on file    Emotionally abused: Not on file    Physically abused: Not on file    Forced sexual activity: Not on file  Other Topics Concern  .  Not on file  Social History Narrative  . Not on file    Review of Systems  Constitutional: Negative.   HENT: Negative.   Eyes: Negative.   Cardiovascular: Negative.   Gastrointestinal: Negative.   Genitourinary:       Vaginal spotting  Musculoskeletal: Negative.   Skin: Negative.   Neurological: Negative.   Endo/Heme/Allergies: Negative.   Psychiatric/Behavioral: Negative.     PHYSICAL EXAMINATION:    BP 118/82 (BP Location: Right Arm, Patient Position: Sitting, Cuff Size: Normal)   Pulse 72   Wt 123 lb 12.8 oz (56.2 kg)   BMI 19.25 kg/m     General appearance: alert, cooperative and appears stated age  ASSESSMENT First trimester bleeding, dropping BhcG, suspect completed SAB    PLAN Pathology from ? POC pending Check BhcG today S/P rhogam last week   An After Visit Summary was printed and given to the patient.

## 2018-10-01 LAB — BETA HCG QUANT (REF LAB): hCG Quant: 21 m[IU]/mL

## 2019-03-17 ENCOUNTER — Telehealth: Payer: Self-pay | Admitting: *Deleted

## 2019-03-17 NOTE — Telephone Encounter (Signed)
Call from patient. Reports late for menses and positive home pregnancy test 1 week ago.  LMP 02-08-19. Declines office confirmation due to Covid 19.  Hx SAB and offered to have office visit to discuss serial labs and possible ultrasound to confirm viability.  Patient again declines office visit for now. Will call if any issues. Advised to proceed with calling OB care to establish appointments and get guidance regarding initial OB care. Congratulations given and call as needed.  Routing to Dr Talbert Nan. Encounter closed.

## 2019-03-21 ENCOUNTER — Telehealth: Payer: Self-pay | Admitting: Obstetrics and Gynecology

## 2019-03-21 NOTE — Telephone Encounter (Signed)
Patient states she is currently pregnant. Has had a miscarriage previously and had to have an immunization following miscarriage due to her blood type. Would like to know if this is something she needs again?

## 2019-03-21 NOTE — Telephone Encounter (Signed)
Spoke with patient. Advised of message as seen below from [place]. Patient verbalizes understanding.

## 2019-03-21 NOTE — Telephone Encounter (Signed)
Spoke with patient. Patient's LMP was 02/08/2019. Took UPT a week ago that was positive. Has mild cramping 03/15/2019. Denies any sharp pain or bleeding. Patient had a miscarriage in 09/2018 and received Rhogam. Patient is asking if there is anything she needs to do at this time for care. Provided information for OB providers to patient who states she has been looking at OBs to establish with.

## 2019-03-21 NOTE — Telephone Encounter (Signed)
Patient without ectopic risk factors. Doesn't need Rhogam at this time (needs it if she has any bleeding). Agree with setting up appointment with OB.

## 2019-04-02 ENCOUNTER — Telehealth: Payer: Self-pay | Admitting: Obstetrics and Gynecology

## 2019-04-02 DIAGNOSIS — Z3490 Encounter for supervision of normal pregnancy, unspecified, unspecified trimester: Secondary | ICD-10-CM

## 2019-04-02 DIAGNOSIS — Z3A01 Less than 8 weeks gestation of pregnancy: Secondary | ICD-10-CM

## 2019-04-02 NOTE — Telephone Encounter (Signed)
Patient is pregnant and needs assessment of pregnancy status if she has not already been seen her her OB office.  Please schedule [place] for tomorrow.   We will then determine if she needs to see Dr. Collene Mares.  Cc Kaitlyn Sprague

## 2019-04-02 NOTE — Addendum Note (Signed)
Addended by: Huey Romans on: 04/02/2019 03:37 PM   Modules accepted: Orders

## 2019-04-02 NOTE — Telephone Encounter (Signed)
Spoke with patient. Patient states that 3-4 weeks ago she had a bowel movement that was painful and she noticed blood in the toilet and in her stool. States she has not seen any blood since. Reports each time she has had a bowel movement since she is having pain that lasts for hours. Denies any change to character of stool or how often she is having a bowel movement. No pain with sitting. Denies any history of hemorrhoids. Does not have a PCP or GI physician. Advised will need to see GI physician. Advised will call Dr.Mann's office to discuss scheduling with COVID 19 state of emergency. Patient is agreeable.  Spoke with Dr.Mann's office. Appointment scheduled for tomorrow 04/03/2019 at 9:45 am with Dr.Mann. Spoke with patient who is agreeable to date and time.  Routing to provider and will close encounter.

## 2019-04-02 NOTE — Telephone Encounter (Signed)
Patient is having rectal pain after bowel movement.

## 2019-04-02 NOTE — Telephone Encounter (Signed)
Call to patient. She has called OB office to establish care two days ago and is waiting to hear back for appointment. Did not report this issue. Denies any current bleeding, vaginal or rectal.  Denies any abdominal or pelvic cramping. Reports severe rectal pain "for hours" in the morning after bowel movement. Pain is worse with sitting. Has some breast tenderness. Denies nausea, vomiting, fatigue. Ultrasound scheduled for tomorrow am at 0800 here in office.  Advised can reach MD on call if any change in symptoms before appointment.  Patient is advised of Covid precautions.   Encounter closed.

## 2019-04-03 ENCOUNTER — Other Ambulatory Visit: Payer: Self-pay

## 2019-04-03 ENCOUNTER — Encounter: Payer: Self-pay | Admitting: Obstetrics and Gynecology

## 2019-04-03 ENCOUNTER — Ambulatory Visit: Payer: BC Managed Care – PPO | Admitting: Obstetrics and Gynecology

## 2019-04-03 ENCOUNTER — Ambulatory Visit (INDEPENDENT_AMBULATORY_CARE_PROVIDER_SITE_OTHER): Payer: BC Managed Care – PPO

## 2019-04-03 ENCOUNTER — Telehealth: Payer: Self-pay | Admitting: Obstetrics and Gynecology

## 2019-04-03 VITALS — BP 126/70 | HR 80 | Temp 98.1°F | Ht 67.25 in | Wt 128.0 lb

## 2019-04-03 DIAGNOSIS — Z3490 Encounter for supervision of normal pregnancy, unspecified, unspecified trimester: Secondary | ICD-10-CM

## 2019-04-03 DIAGNOSIS — K6289 Other specified diseases of anus and rectum: Secondary | ICD-10-CM | POA: Diagnosis not present

## 2019-04-03 DIAGNOSIS — Z3A01 Less than 8 weeks gestation of pregnancy: Secondary | ICD-10-CM

## 2019-04-03 DIAGNOSIS — N912 Amenorrhea, unspecified: Secondary | ICD-10-CM | POA: Diagnosis not present

## 2019-04-03 LAB — POCT URINE PREGNANCY: Preg Test, Ur: POSITIVE — AB

## 2019-04-03 NOTE — Telephone Encounter (Signed)
Left message to call Ege Muckey at [phone]. 

## 2019-04-03 NOTE — Telephone Encounter (Signed)
Patient is calling regarding the recommendations for taking a bath during pregnancy. Patient stated that she has an anal fissure and was told by her GI doctor to sit in the bath. Patient wanted to know if that was OK with pregnancy.

## 2019-04-03 NOTE — Progress Notes (Signed)
Encounter reviewed by Dr. Joedy Eickhoff Amundson C. Silva.  

## 2019-04-03 NOTE — Telephone Encounter (Signed)
OK for bath as long as temperature is controlled as you mentioned.  A sitz bath may give her the best relief for an anal fissure.

## 2019-04-03 NOTE — Progress Notes (Signed)
GYNECOLOGY  VISIT   HPI: 33 y.o.   Married  Caucasian  female   G2P0000 with Patient's last menstrual period was 02/08/2019 (exact date).   here for pregnancy [place].     Patient called yesterday with rectal pain after bowel movements.  Feeling better each day.  Sitting a lot. Seems like the pain is moving upward.  In the am feels nothing.  As the day progresses, she has pain in rectum after a BM.  Denies abdominal pain.  Did strain a lot and she noted blood from the rectum.  The blood has stopped. States she is not constipated.   Denies nausea or fevers.   Denies vaginal bleeding.  Hx SAB.   Has an appointment with Dr. Collene Mares today.  She wants to establish care at [facility].  She is waiting for an appointment.   GYNECOLOGIC HISTORY: Patient's last menstrual period was 02/08/2019 (exact date). Contraception:  none Menopausal hormone therapy:  none Last mammogram:  none Last pap smear:   03/13/18 Neg. HR HPV:neg         OB History    Gravida  2   Para  0   Term  0   Preterm  0   AB  1   Living  0     SAB  1   TAB  0   Ectopic  0   Multiple  0   Live Births                 There are no active problems to display for this patient.   Past Medical History:  Diagnosis Date  . Dysmenorrhea   . Elevated cholesterol   . 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 Vitamin D deficiency     Past Surgical History:  Procedure Laterality Date  . MOUTH SURGERY  age 28   wisdom teeth upper age 23, lower age 43    Current Outpatient Medications  Medication Sig Dispense Refill  . cholecalciferol (VITAMIN D3) 25 MCG (1000 UT) tablet Take 1,000 Units by mouth daily.    . Prenatal Vit-Fe Fumarate-FA (MULTIVITAMIN-PRENATAL) 27-0.8 MG TABS tablet Take 1 tablet by mouth daily at 12 noon.     No current facility-administered medications for this visit.      ALLERGIES: Patient has no known allergies.  Family History  Problem  Relation Age of Onset  . Parkinson's disease Mother   . Diabetes Father   . Breast cancer Paternal Aunt 49       bilateral mastectomy - BRCA test unknown  . Heart failure Paternal Grandfather     Social History   Socioeconomic History  . Marital status: Married    Spouse name: Not on file  . Number of children: Not on file  . Years of education: Not on file  . Highest education level: Not on file  Occupational History  . Not on file  Social Needs  . Financial resource strain: Not on file  . Food insecurity:    Worry: Not on file    Inability: Not on file  . Transportation needs:    Medical: Not on file    Non-medical: Not on file  Tobacco Use  . Smoking status: Never Smoker  . Smokeless tobacco: Never Used  Substance and Sexual Activity  . Alcohol use: No  . Drug use: No  . Sexual activity: Yes    Partners: Male  Birth control/protection: Coitus interruptus  Lifestyle  . Physical activity:    Days per week: Not on file    Minutes per session: Not on file  . Stress: Not on file  Relationships  . Social connections:    Talks on phone: Not on file    Gets together: Not on file    Attends religious service: Not on file    Active member of club or organization: Not on file    Attends meetings of clubs or organizations: Not on file    Relationship status: Not on file  . Intimate partner violence:    Fear of current or ex partner: Not on file    Emotionally abused: Not on file    Physically abused: Not on file    Forced sexual activity: Not on file  Other Topics Concern  . Not on file  Social History Narrative  . Not on file    Review of Systems  All other systems reviewed and are negative.   PHYSICAL EXAMINATION:    BP 126/70   Pulse 80   Temp 98.1 F (36.7 C) (Oral)   Ht 5' 7.25" (1.708 m)   Wt 128 lb (58.1 kg)   LMP 02/08/2019 (Exact Date)   BMI 19.90 kg/m     General appearance: alert, cooperative and appears stated age    Pelvic: External  genitalia:  no lesions              Urethra:  normal appearing urethra with no masses, tenderness or lesions              Bartholins and Skenes: normal                 Vagina: normal appearing vagina with normal color and discharge, no lesions              Cervix: no lesions                Bimanual Exam:  Uterus:  7 week size, contour, position, consistency, mobility, non-tender              Adnexa: no mass, fullness, tenderness              Rectal exam: Yes.  .  Confirms.              Anus:  normal sphincter tone, no lesions.  Tender with exam.   Chaperone was present for exam.  Pelvic US Viable IUP.  7+5 weeks.  Normal cervix.  Normal ovaries with small CL cyst.  No free fluid. Normal rectal region.   ASSESSMENT  Amenorrhea. Viable IUP.  Size equals dates. Rectal pain. Uncertain etiology.   PLAN  Reassurance regarding pregnancy location, growth, and viability.  She will establish care with OB office.  Keep appointment with Dr. Collene Mares this morning.    An After Visit Summary was printed and given to the patient.  _15_____ minutes face to face time of which over 50% was spent in counseling.

## 2019-04-03 NOTE — Telephone Encounter (Signed)
Spoke with patient. Advised patient okay to take a luke warm bath soak. Advised patient to only fill tub to soak her bottom. Not to use hot water as you do not want your body temperature to raise to high during pregnancy. Advised not to sit in a very warm tub. Patient verbalizes understanding.  Routing to [place] for review and advise.

## 2019-04-04 NOTE — Telephone Encounter (Signed)
Patient has been notified of recommendations. Encounter closed.

## 2019-04-08 ENCOUNTER — Telehealth: Payer: Self-pay | Admitting: Obstetrics and Gynecology

## 2019-04-08 NOTE — Telephone Encounter (Signed)
Please contact patient in follow up to her visit with Dr. Collene Mares.  I recommend she get a copy of her visit there and her lab work and bring it to her OB provider for her first visit.   She had a low TSH level.

## 2019-04-09 NOTE — Telephone Encounter (Signed)
Spoke with patient, advised as seen below per Dr. Quincy Simmonds. Patient verbalizes understanding and is agreeable.   Copy of report to scan.   Encounter closed.

## 2019-04-16 ENCOUNTER — Ambulatory Visit: Payer: BC Managed Care – PPO | Admitting: Obstetrics and Gynecology

## 2019-10-24 LAB — OB RESULTS CONSOLE GBS: GBS: NEGATIVE

## 2019-11-03 ENCOUNTER — Other Ambulatory Visit: Payer: Self-pay

## 2019-11-03 ENCOUNTER — Encounter (HOSPITAL_COMMUNITY): Payer: Self-pay | Admitting: *Deleted

## 2019-11-03 ENCOUNTER — Inpatient Hospital Stay (EMERGENCY_DEPARTMENT_HOSPITAL)
Admission: AD | Admit: 2019-11-03 | Discharge: 2019-11-03 | Disposition: A | Discharge status: Home / Self Care | Payer: BC Managed Care – PPO | Source: Home / Self Care | Attending: Obstetrics & Gynecology | Admitting: Obstetrics & Gynecology

## 2019-11-03 DIAGNOSIS — O26893 Other specified pregnancy related conditions, third trimester: Secondary | ICD-10-CM | POA: Insufficient documentation

## 2019-11-03 DIAGNOSIS — R030 Elevated blood-pressure reading, without diagnosis of hypertension: Secondary | ICD-10-CM | POA: Insufficient documentation

## 2019-11-03 DIAGNOSIS — O471 False labor at or after 37 completed weeks of gestation: Secondary | ICD-10-CM | POA: Insufficient documentation

## 2019-11-03 DIAGNOSIS — Z3A38 38 weeks gestation of pregnancy: Secondary | ICD-10-CM | POA: Insufficient documentation

## 2019-11-03 DIAGNOSIS — O09523 Supervision of elderly multigravida, third trimester: Secondary | ICD-10-CM | POA: Insufficient documentation

## 2019-11-03 DIAGNOSIS — O4693 Antepartum hemorrhage, unspecified, third trimester: Secondary | ICD-10-CM | POA: Insufficient documentation

## 2019-11-03 HISTORY — DX: Scoliosis, unspecified: M41.9

## 2019-11-03 LAB — COMPREHENSIVE METABOLIC PANEL
ALT: 19 U/L (ref 0–44)
AST: 28 U/L (ref 15–41)
Albumin: 2.9 g/dL — ABNORMAL LOW (ref 3.5–5.0)
Alkaline Phosphatase: 160 U/L — ABNORMAL HIGH (ref 38–126)
Anion gap: 15 (ref 5–15)
BUN: 6 mg/dL (ref 6–20)
CO2: 18 mmol/L — ABNORMAL LOW (ref 22–32)
Calcium: 9.2 mg/dL (ref 8.9–10.3)
Chloride: 101 mmol/L (ref 98–111)
Creatinine, Ser: 0.69 mg/dL (ref 0.44–1.00)
GFR calc Af Amer: >60 mL/min (ref >60)
GFR calc non Af Amer: >60 mL/min (ref >60)
Glucose, Bld: 125 mg/dL — ABNORMAL HIGH (ref 70–99)
Potassium: 3.6 mmol/L (ref 3.5–5.1)
Sodium: 134 mmol/L — ABNORMAL LOW (ref 135–145)
Total Bilirubin: 0.4 mg/dL (ref 0.3–1.2)
Total Protein: 6.3 g/dL — ABNORMAL LOW (ref 6.5–8.1)

## 2019-11-03 LAB — CBC
HCT: 40.6 % (ref 36.0–46.0)
Hemoglobin: 13.9 g/dL (ref 12.0–15.0)
MCH: 30.7 pg (ref 26.0–34.0)
MCHC: 34.2 g/dL (ref 30.0–36.0)
MCV: 89.6 fL (ref 80.0–100.0)
Platelets: 227 10*3/uL (ref 150–400)
RBC: 4.53 MIL/uL (ref 3.87–5.11)
RDW: 13.2 % (ref 11.5–15.5)
WBC: 21.6 10*3/uL — ABNORMAL HIGH (ref 4.0–10.5)
nRBC: 0 % (ref 0.0–0.2)

## 2019-11-03 LAB — PROTEIN / CREATININE RATIO, URINE
Creatinine, Urine: 173.84 mg/dL
Protein Creatinine Ratio: 0.14 mg/mg{Cre} (ref 0.00–0.15)
Total Protein, Urine: 24 mg/dL

## 2019-11-03 MED ORDER — BUTORPHANOL TARTRATE 1 MG/ML IJ SOLN
1.0000 mg | Freq: Once | INTRAMUSCULAR | Status: AC
  Administered 2019-11-03: 1 mg via INTRAMUSCULAR
  Filled 2019-11-03: qty 1

## 2019-11-03 MED ORDER — PROMETHAZINE HCL 25 MG/ML IJ SOLN
25.0000 mg | Freq: Once | INTRAMUSCULAR | Status: AC
  Administered 2019-11-03: 25 mg via INTRAMUSCULAR
  Filled 2019-11-03: qty 1

## 2019-11-03 NOTE — MAU Note (Signed)
Pt sent from [facility] office for EFM secondary VB after cervical exam.  Denies ROL.  Endorses +FM.

## 2019-11-03 NOTE — MAU Provider Note (Signed)
History     CSN: [id number]  Arrival date and time: 11/03/19 1222   First Provider Initiated Contact with Patient 11/03/19 1319      Chief Complaint  Patient presents with  . EFM   HPI  Ms.  Bethany Campos is a 33 y.o. year old G59P0010 female at 58w2dweeks gestation [facility] [facility] [place] "[place] bleeding after a vaginal exam." She reports that she started contracting around 0200 this morning every 2-5 minutes. She was dilated 3/100/-2 by Dr. MBenjie Karvonen Her doula CMargreta Journeyis present with her at bedside offering labor support and asking questions. Her GBS is negative and she has had no complications during pregnancy.   She has had 2 elevated BPs readings while arriving to MAU. After review of the prenatal records, she had some elevated BPs in early pregnancy. Per the prenatal record, she had an elevated BP with her 1st GYN visit and all other subsequent GYN only visits.  Past Medical History:  Diagnosis Date  . Dysmenorrhea   . Elevated cholesterol   . Fibroadenoma of breast 2011 - 2013 [place]    right breast 10 o'clock postion  . Scoliosis   . Vitamin D deficiency     Past Surgical History:  Procedure Laterality Date  . MOUTH SURGERY  age 89  wisdom teeth upper age 33 lower age 33   Family History  Problem Relation Age of Onset  . Parkinson's disease Mother   . Diabetes Father   . Breast cancer Paternal Aunt 421      bilateral mastectomy - BRCA test unknown  . Heart failure Paternal Grandfather     Social History   Tobacco Use  . Smoking status: Never Smoker  . Smokeless tobacco: Never Used  Substance Use Topics  . Alcohol use: No  . Drug use: No    Allergies: No Known Allergies  Medications Prior to Admission  Medication Sig Dispense Refill Last Dose  . cholecalciferol (VITAMIN D3) 25 MCG (1000 UT) tablet Take 1,000 Units by mouth daily.   11/02/2019 at 1000  . Prenatal Vit-Fe Fumarate-FA  (MULTIVITAMIN-PRENATAL) 27-0.8 MG TABS tablet Take 1 tablet by mouth daily at 12 noon.   11/02/2019 at 1800    Review of Systems  Constitutional: Negative.   HENT: Negative.   Eyes: Negative.   Respiratory: Negative.   Cardiovascular: Negative.   Gastrointestinal: Negative.   Endocrine: Negative.   Genitourinary: Positive for pelvic pain (intermittent ).  Musculoskeletal: Positive for back pain (with UC's).  Skin: Negative.   Allergic/Immunologic: Negative.   Neurological: Negative.   Hematological: Negative.   Psychiatric/Behavioral: Negative.    Physical Exam   Patient Vitals for the past 24 hrs:  BP Temp Temp src Pulse Resp SpO2 Height Weight  11/03/19 1500 125/76 - - 92 - - - -  11/03/19 1445 127/70 - - 94 - - - -  11/03/19 1430 (!) 145/96 - - (!) 117 - - - -  11/03/19 1415 (!) 137/100 - - (!) 122 - - - -  11/03/19 1345 138/73 - - 90 - - - -  11/03/19 1330 124/61 - - 98 - - - -  11/03/19 1315 139/81 - - 100 - - - -  11/03/19 1300 (!) 145/110 - - (!) 112 - - - -  11/03/19 1250 (!) 146/108 - - (!) 114 - - - -  11/03/19 1236 (!) 144/98 98.4 F (  36.9 C) Oral (!) 113 20 100 % '5\' 7"'$  (1.702 m) 70.9 kg    Physical Exam  Nursing note and vitals reviewed. Constitutional: She is oriented to person, place, and time. She appears well-developed and well-nourished.  HENT:  Head: Normocephalic and atraumatic.  Eyes: Pupils are equal, round, and reactive to light.  Neck: Normal range of motion.  Cardiovascular: Normal rate.  Respiratory: Effort normal.  GI: Soft.  Genitourinary:    Genitourinary Comments: Dilation: 3 Effacement (%): 70 Cervical Position: Middle Station: -3, ballotable Presentation: Vertex Exam by: Sunday Corn, CNM    Musculoskeletal: Normal range of motion.  Neurological: She is alert and oriented to person, place, and time.  Skin: Skin is warm and dry.  Psychiatric: She has a normal mood and affect. Her behavior is normal. Judgment and thought content  normal.   NST - FHR: 125 bpm / moderate variability / accels present / decels absent / TOCO: regular every 2-4 mins  @ 1515 patient stats the UCs are getting stronger and requesting pain medication  Cervical Recheck @ 1515: Dilation: 3 Effacement (%): 70 Cervical Position: Middle Station: -2 Presentation: Vertex Exam by:: Sunday Corn, CNM  **only change is in station and head is well-applied at this time.  MAU Course  Procedures  MDM CCUA CBC CMP P/C Ratio Serial BP's  Stadol 1 mg with Phenergan 25 mg IM -- pain improved to a tolerable level  Results for orders placed or performed during the hospital encounter of 11/03/19 (from the past 24 hour(s))  CBC     Status: Abnormal   Collection Time: 11/03/19  1:16 PM  Result Value Ref Range   WBC 21.6 (H) 4.0 - 10.5 K/uL   RBC 4.53 3.87 - 5.11 MIL/uL   Hemoglobin 13.9 12.0 - 15.0 g/dL   HCT 40.6 36.0 - 46.0 %   MCV 89.6 80.0 - 100.0 fL   MCH 30.7 26.0 - 34.0 pg   MCHC 34.2 30.0 - 36.0 g/dL   RDW 13.2 11.5 - 15.5 %   Platelets 227 150 - 400 K/uL   nRBC 0.0 0.0 - 0.2 %  Comprehensive metabolic panel     Status: Abnormal   Collection Time: 11/03/19  1:16 PM  Result Value Ref Range   Sodium 134 (L) 135 - 145 mmol/L   Potassium 3.6 3.5 - 5.1 mmol/L   Chloride 101 98 - 111 mmol/L   CO2 18 (L) 22 - 32 mmol/L   Glucose, Bld 125 (H) 70 - 99 mg/dL   BUN 6 6 - 20 mg/dL   Creatinine, Ser 0.69 0.44 - 1.00 mg/dL   Calcium 9.2 8.9 - 10.3 mg/dL   Total Protein 6.3 (L) 6.5 - 8.1 g/dL   Albumin 2.9 (L) 3.5 - 5.0 g/dL   AST 28 15 - 41 U/L   ALT 19 0 - 44 U/L   Alkaline Phosphatase 160 (H) 38 - 126 U/L   Total Bilirubin 0.4 0.3 - 1.2 mg/dL   GFR calc non Af Amer >60 >60 mL/min   GFR calc Af Amer >60 >60 mL/min   Anion gap 15 5 - 15  Protein / creatinine ratio, urine     Status: None   Collection Time: 11/03/19  2:00 PM  Result Value Ref Range   Creatinine, Urine 173.84 mg/dL   Total Protein, Urine 24 mg/dL   Protein Creatinine Ratio  0.14 0.00 - 0.15 mg/mg[Cre]      Assessment and Plan  False labor after 37  weeks of gestation without delivery - Plan: Discharge patient - Advised that no cervical change after 5 hours of observation is not indicative of labor at this time - Information provided on [facility] ctxs - Reassurance given that the VB she is having is normal bloody show - Call OB office for any problems, concerns or questions - Patient verbalized an understanding of the plan of care and agrees.    Laury Deep, MSN, CNM 11/03/2019, 1:20 PM

## 2019-11-03 NOTE — Progress Notes (Signed)
Pt standing @ bedside breathing with ctxs.  Doula in attendance.

## 2019-11-04 ENCOUNTER — Encounter (HOSPITAL_COMMUNITY): Payer: Self-pay

## 2019-11-04 ENCOUNTER — Encounter (HOSPITAL_COMMUNITY)
Admission: AD | Disposition: A | Discharge status: Home / Self Care | Payer: Self-pay | Source: Home / Self Care | Attending: Obstetrics and Gynecology

## 2019-11-04 ENCOUNTER — Other Ambulatory Visit: Payer: Self-pay

## 2019-11-04 ENCOUNTER — Inpatient Hospital Stay (HOSPITAL_COMMUNITY): Payer: BC Managed Care – PPO | Admitting: Anesthesiology

## 2019-11-04 ENCOUNTER — Inpatient Hospital Stay (HOSPITAL_COMMUNITY)
Admission: AD | Admit: 2019-11-04 | Discharge: 2019-11-06 | DRG: 788 | Disposition: A | Discharge status: Home / Self Care | Payer: BC Managed Care – PPO | Attending: Obstetrics and Gynecology | Admitting: Obstetrics and Gynecology

## 2019-11-04 DIAGNOSIS — Z6791 Unspecified blood type, Rh negative: Secondary | ICD-10-CM

## 2019-11-04 DIAGNOSIS — O139 Gestational [pregnancy-induced] hypertension without significant proteinuria, unspecified trimester: Secondary | ICD-10-CM | POA: Diagnosis present

## 2019-11-04 DIAGNOSIS — O471 False labor at or after 37 completed weeks of gestation: Secondary | ICD-10-CM | POA: Diagnosis not present

## 2019-11-04 DIAGNOSIS — O134 Gestational [pregnancy-induced] hypertension without significant proteinuria, complicating childbirth: Secondary | ICD-10-CM | POA: Diagnosis present

## 2019-11-04 DIAGNOSIS — Z20828 Contact with and (suspected) exposure to other viral communicable diseases: Secondary | ICD-10-CM | POA: Diagnosis present

## 2019-11-04 DIAGNOSIS — Z3A38 38 weeks gestation of pregnancy: Secondary | ICD-10-CM | POA: Diagnosis not present

## 2019-11-04 DIAGNOSIS — O26899 Other specified pregnancy related conditions, unspecified trimester: Secondary | ICD-10-CM

## 2019-11-04 DIAGNOSIS — O26893 Other specified pregnancy related conditions, third trimester: Secondary | ICD-10-CM | POA: Diagnosis present

## 2019-11-04 LAB — CBC
HCT: 39.8 % (ref 36.0–46.0)
Hemoglobin: 13.9 g/dL (ref 12.0–15.0)
MCH: 31.4 pg (ref 26.0–34.0)
MCHC: 34.9 g/dL (ref 30.0–36.0)
MCV: 90 fL (ref 80.0–100.0)
Platelets: 240 10*3/uL (ref 150–400)
RBC: 4.42 MIL/uL (ref 3.87–5.11)
RDW: 13.2 % (ref 11.5–15.5)
WBC: 24.2 10*3/uL — ABNORMAL HIGH (ref 4.0–10.5)
nRBC: 0 % (ref 0.0–0.2)

## 2019-11-04 LAB — RPR: RPR Ser Ql: NONREACTIVE

## 2019-11-04 LAB — POCT FERN TEST: POCT Fern Test: POSITIVE — AB

## 2019-11-04 LAB — SARS CORONAVIRUS 2 (TAT 6-24 HRS): SARS Coronavirus 2: NEGATIVE

## 2019-11-04 SURGERY — Surgical Case
Anesthesia: Epidural | Wound class: Clean Contaminated

## 2019-11-04 MED ORDER — SIMETHICONE 80 MG PO CHEW: 80.0000 mg | CHEWABLE_TABLET | ORAL | Status: DC | PRN

## 2019-11-04 MED ORDER — SODIUM CHLORIDE 0.9% FLUSH: 3.0000 mL | INTRAVENOUS | Status: DC | PRN

## 2019-11-04 MED ORDER — KETOROLAC TROMETHAMINE 30 MG/ML IJ SOLN: 30.0000 mg | Freq: Four times a day (QID) | INTRAMUSCULAR | Status: AC | PRN

## 2019-11-04 MED ORDER — PROMETHAZINE HCL 25 MG/ML IJ SOLN: 6.2500 mg | INTRAMUSCULAR | Status: DC | PRN

## 2019-11-04 MED ORDER — MEPERIDINE HCL 25 MG/ML IJ SOLN: 6.2500 mg | INTRAMUSCULAR | Status: DC | PRN

## 2019-11-04 MED ORDER — EPHEDRINE 5 MG/ML INJ: 10.0000 mg | INTRAVENOUS | Status: DC | PRN

## 2019-11-04 MED ORDER — NALBUPHINE HCL 10 MG/ML IJ SOLN
5.0000 mg | Freq: Once | INTRAMUSCULAR | Status: DC | PRN
  Filled 2019-11-04: qty 0.5

## 2019-11-04 MED ORDER — FENTANYL CITRATE (PF) 100 MCG/2ML IJ SOLN: 100.0000 ug | INTRAMUSCULAR | Status: DC | PRN

## 2019-11-04 MED ORDER — DIPHENHYDRAMINE HCL 50 MG/ML IJ SOLN: 12.5000 mg | INTRAMUSCULAR | Status: DC | PRN

## 2019-11-04 MED ORDER — OXYCODONE HCL 5 MG/5ML PO SOLN: 5.0000 mg | Freq: Once | ORAL | Status: DC | PRN

## 2019-11-04 MED ORDER — SIMETHICONE 80 MG PO CHEW
80.0000 mg | CHEWABLE_TABLET | Freq: Three times a day (TID) | ORAL | Status: DC
  Administered 2019-11-04 – 2019-11-06 (×7): 80 mg via ORAL
  Filled 2019-11-04 (×7): qty 1

## 2019-11-04 MED ORDER — OXYTOCIN 40 UNITS IN NORMAL SALINE INFUSION - SIMPLE MED: 2.5000 [IU]/h | INTRAVENOUS | Status: DC

## 2019-11-04 MED ORDER — MEPERIDINE HCL 25 MG/ML IJ SOLN
INTRAMUSCULAR | Status: DC | PRN
  Administered 2019-11-04 (×2): 12.5 mg via INTRAVENOUS

## 2019-11-04 MED ORDER — HYDROMORPHONE HCL 1 MG/ML IJ SOLN: 0.2500 mg | INTRAMUSCULAR | Status: DC | PRN

## 2019-11-04 MED ORDER — PRENATAL MULTIVITAMIN CH
1.0000 | ORAL_TABLET | Freq: Every day | ORAL | Status: DC
  Administered 2019-11-04 – 2019-11-06 (×3): 1 via ORAL
  Filled 2019-11-04 (×3): qty 1

## 2019-11-04 MED ORDER — IBUPROFEN 800 MG PO TABS
800.0000 mg | ORAL_TABLET | Freq: Four times a day (QID) | ORAL | Status: DC
  Administered 2019-11-05 – 2019-11-06 (×6): 800 mg via ORAL
  Filled 2019-11-04 (×6): qty 1

## 2019-11-04 MED ORDER — SODIUM CHLORIDE 0.9 % IV SOLN
INTRAVENOUS | Status: DC | PRN
  Administered 2019-11-04: 05:00:00 via INTRAVENOUS

## 2019-11-04 MED ORDER — FERROUS SULFATE 325 (65 FE) MG PO TABS
325.0000 mg | ORAL_TABLET | Freq: Two times a day (BID) | ORAL | Status: DC
  Administered 2019-11-04 – 2019-11-06 (×5): 325 mg via ORAL
  Filled 2019-11-04 (×6): qty 1

## 2019-11-04 MED ORDER — MORPHINE SULFATE (PF) 0.5 MG/ML IJ SOLN
INTRAMUSCULAR | Status: DC | PRN
  Administered 2019-11-04: 3 mg via EPIDURAL

## 2019-11-04 MED ORDER — SCOPOLAMINE 1 MG/3DAYS TD PT72
1.0000 | MEDICATED_PATCH | Freq: Once | TRANSDERMAL | Status: DC
  Administered 2019-11-04: 1.5 mg via TRANSDERMAL

## 2019-11-04 MED ORDER — CEFAZOLIN SODIUM-DEXTROSE 2-4 GM/100ML-% IV SOLN
INTRAVENOUS | Status: AC
  Filled 2019-11-04: qty 100

## 2019-11-04 MED ORDER — DEXAMETHASONE SODIUM PHOSPHATE 4 MG/ML IJ SOLN
INTRAMUSCULAR | Status: AC
  Filled 2019-11-04: qty 1

## 2019-11-04 MED ORDER — LACTATED RINGERS IV SOLN: 500.0000 mL | INTRAVENOUS | Status: DC | PRN

## 2019-11-04 MED ORDER — SODIUM CHLORIDE (PF) 0.9 % IJ SOLN
INTRAMUSCULAR | Status: DC | PRN
  Administered 2019-11-04: 12 mL/h via EPIDURAL

## 2019-11-04 MED ORDER — CEFAZOLIN SODIUM-DEXTROSE 2-3 GM-%(50ML) IV SOLR
INTRAVENOUS | Status: DC | PRN
  Administered 2019-11-04: 2 g via INTRAVENOUS

## 2019-11-04 MED ORDER — FENTANYL-BUPIVACAINE-NACL 0.5-0.125-0.9 MG/250ML-% EP SOLN
EPIDURAL | Status: AC
  Filled 2019-11-04: qty 250

## 2019-11-04 MED ORDER — NALOXONE HCL 4 MG/10ML IJ SOLN
1.0000 ug/kg/h | INTRAVENOUS | Status: DC | PRN
  Filled 2019-11-04: qty 5

## 2019-11-04 MED ORDER — SOD CITRATE-CITRIC ACID 500-334 MG/5ML PO SOLN
30.0000 mL | ORAL | Status: DC | PRN
  Administered 2019-11-04: 30 mL via ORAL
  Filled 2019-11-04: qty 30

## 2019-11-04 MED ORDER — BUPIVACAINE HCL (PF) 0.25 % IJ SOLN
INTRAMUSCULAR | Status: AC
  Filled 2019-11-04: qty 30

## 2019-11-04 MED ORDER — DIBUCAINE (PERIANAL) 1 % EX OINT: 1.0000 "application " | TOPICAL_OINTMENT | CUTANEOUS | Status: DC | PRN

## 2019-11-04 MED ORDER — LACTATED RINGERS IV SOLN
INTRAVENOUS | Status: DC | PRN
  Administered 2019-11-04 (×2): via INTRAVENOUS

## 2019-11-04 MED ORDER — ONDANSETRON HCL 4 MG/2ML IJ SOLN
INTRAMUSCULAR | Status: AC
  Filled 2019-11-04: qty 2

## 2019-11-04 MED ORDER — KETOROLAC TROMETHAMINE 30 MG/ML IJ SOLN
30.0000 mg | Freq: Four times a day (QID) | INTRAMUSCULAR | Status: AC
  Administered 2019-11-04 – 2019-11-05 (×4): 30 mg via INTRAVENOUS
  Filled 2019-11-04 (×4): qty 1

## 2019-11-04 MED ORDER — COCONUT OIL OIL: 1.0000 "application " | TOPICAL_OIL | Status: DC | PRN

## 2019-11-04 MED ORDER — SODIUM CHLORIDE 0.9% FLUSH
3.0000 mL | Freq: Two times a day (BID) | INTRAVENOUS | Status: DC
  Administered 2019-11-05: 3 mL via INTRAVENOUS

## 2019-11-04 MED ORDER — ACETAMINOPHEN 325 MG PO TABS: 650.0000 mg | ORAL_TABLET | ORAL | Status: DC | PRN

## 2019-11-04 MED ORDER — OXYCODONE-ACETAMINOPHEN 5-325 MG PO TABS: 2.0000 | ORAL_TABLET | ORAL | Status: DC | PRN

## 2019-11-04 MED ORDER — SODIUM CHLORIDE 0.9 % IV SOLN: 250.0000 mL | INTRAVENOUS | Status: DC

## 2019-11-04 MED ORDER — OXYCODONE HCL 5 MG PO TABS: 5.0000 mg | ORAL_TABLET | ORAL | Status: DC | PRN

## 2019-11-04 MED ORDER — ONDANSETRON HCL 4 MG/2ML IJ SOLN
INTRAMUSCULAR | Status: DC | PRN
  Administered 2019-11-04: 4 mg via INTRAVENOUS

## 2019-11-04 MED ORDER — MENTHOL 3 MG MT LOZG: 1.0000 | LOZENGE | OROMUCOSAL | Status: DC | PRN

## 2019-11-04 MED ORDER — OXYTOCIN BOLUS FROM INFUSION: 500.0000 mL | Freq: Once | INTRAVENOUS | Status: DC

## 2019-11-04 MED ORDER — OXYTOCIN 40 UNITS IN NORMAL SALINE INFUSION - SIMPLE MED
INTRAVENOUS | Status: AC
  Filled 2019-11-04: qty 1000

## 2019-11-04 MED ORDER — SODIUM BICARBONATE 8.4 % IV SOLN
INTRAVENOUS | Status: DC | PRN
  Administered 2019-11-04: 4 mL via EPIDURAL
  Administered 2019-11-04: 5 mL via EPIDURAL

## 2019-11-04 MED ORDER — DIPHENHYDRAMINE HCL 25 MG PO CAPS: 25.0000 mg | ORAL_CAPSULE | ORAL | Status: DC | PRN

## 2019-11-04 MED ORDER — OXYTOCIN 40 UNITS IN NORMAL SALINE INFUSION - SIMPLE MED
INTRAVENOUS | Status: DC | PRN
  Administered 2019-11-04: 40 [IU] via INTRAVENOUS

## 2019-11-04 MED ORDER — KETOROLAC TROMETHAMINE 30 MG/ML IJ SOLN: 30.0000 mg | Freq: Once | INTRAMUSCULAR | Status: DC | PRN

## 2019-11-04 MED ORDER — ONDANSETRON HCL 4 MG/2ML IJ SOLN: 4.0000 mg | Freq: Four times a day (QID) | INTRAMUSCULAR | Status: DC | PRN

## 2019-11-04 MED ORDER — MORPHINE SULFATE (PF) 0.5 MG/ML IJ SOLN
INTRAMUSCULAR | Status: AC
  Filled 2019-11-04: qty 10

## 2019-11-04 MED ORDER — SIMETHICONE 80 MG PO CHEW
80.0000 mg | CHEWABLE_TABLET | ORAL | Status: DC
  Administered 2019-11-05 (×2): 80 mg via ORAL
  Filled 2019-11-04 (×2): qty 1

## 2019-11-04 MED ORDER — PHENYLEPHRINE 40 MCG/ML (10ML) SYRINGE FOR IV PUSH (FOR BLOOD PRESSURE SUPPORT): 80.0000 ug | PREFILLED_SYRINGE | INTRAVENOUS | Status: DC | PRN

## 2019-11-04 MED ORDER — ONDANSETRON HCL 4 MG/2ML IJ SOLN: 4.0000 mg | Freq: Three times a day (TID) | INTRAMUSCULAR | Status: DC | PRN

## 2019-11-04 MED ORDER — METHYLERGONOVINE MALEATE 0.2 MG PO TABS: 0.2000 mg | ORAL_TABLET | ORAL | Status: DC | PRN

## 2019-11-04 MED ORDER — MEPERIDINE HCL 25 MG/ML IJ SOLN
INTRAMUSCULAR | Status: AC
  Filled 2019-11-04: qty 1

## 2019-11-04 MED ORDER — METHYLERGONOVINE MALEATE 0.2 MG/ML IJ SOLN: 0.2000 mg | INTRAMUSCULAR | Status: DC | PRN

## 2019-11-04 MED ORDER — OXYTOCIN 40 UNITS IN NORMAL SALINE INFUSION - SIMPLE MED: 2.5000 [IU]/h | INTRAVENOUS | Status: AC

## 2019-11-04 MED ORDER — DEXAMETHASONE SODIUM PHOSPHATE 4 MG/ML IJ SOLN
INTRAMUSCULAR | Status: DC | PRN
  Administered 2019-11-04: 4 mg via INTRAVENOUS

## 2019-11-04 MED ORDER — SCOPOLAMINE 1 MG/3DAYS TD PT72
MEDICATED_PATCH | TRANSDERMAL | Status: AC
  Filled 2019-11-04: qty 1

## 2019-11-04 MED ORDER — FENTANYL-BUPIVACAINE-NACL 0.5-0.125-0.9 MG/250ML-% EP SOLN: 12.0000 mL/h | EPIDURAL | Status: DC | PRN

## 2019-11-04 MED ORDER — BISACODYL 10 MG RE SUPP: 10.0000 mg | Freq: Every day | RECTAL | Status: DC | PRN

## 2019-11-04 MED ORDER — LIDOCAINE HCL (PF) 1 % IJ SOLN
INTRAMUSCULAR | Status: DC | PRN
  Administered 2019-11-04: 2 mL via EPIDURAL
  Administered 2019-11-04: 10 mL via EPIDURAL

## 2019-11-04 MED ORDER — DEXTROSE 5 % IV SOLN
INTRAVENOUS | Status: AC
  Filled 2019-11-04: qty 3000

## 2019-11-04 MED ORDER — ZOLPIDEM TARTRATE 5 MG PO TABS: 5.0000 mg | ORAL_TABLET | Freq: Every evening | ORAL | Status: DC | PRN

## 2019-11-04 MED ORDER — LACTATED RINGERS AMNIOINFUSION
INTRAVENOUS | Status: DC
  Administered 2019-11-04: 03:00:00 via INTRAUTERINE

## 2019-11-04 MED ORDER — SENNOSIDES-DOCUSATE SODIUM 8.6-50 MG PO TABS
2.0000 | ORAL_TABLET | ORAL | Status: DC
  Administered 2019-11-05 (×2): 2 via ORAL
  Filled 2019-11-04 (×2): qty 2

## 2019-11-04 MED ORDER — OXYCODONE-ACETAMINOPHEN 5-325 MG PO TABS: 1.0000 | ORAL_TABLET | ORAL | Status: DC | PRN

## 2019-11-04 MED ORDER — LACTATED RINGERS IV SOLN
INTRAVENOUS | Status: DC
  Administered 2019-11-04: 02:00:00 via INTRAVENOUS

## 2019-11-04 MED ORDER — FLEET ENEMA 7-19 GM/118ML RE ENEM: 1.0000 | ENEMA | Freq: Every day | RECTAL | Status: DC | PRN

## 2019-11-04 MED ORDER — OXYTOCIN 10 UNIT/ML IJ SOLN: 10.0000 [IU] | Freq: Once | INTRAMUSCULAR | Status: DC

## 2019-11-04 MED ORDER — NALBUPHINE HCL 10 MG/ML IJ SOLN
5.0000 mg | INTRAMUSCULAR | Status: DC | PRN
  Filled 2019-11-04: qty 0.5

## 2019-11-04 MED ORDER — OXYCODONE HCL 5 MG PO TABS: 5.0000 mg | ORAL_TABLET | Freq: Once | ORAL | Status: DC | PRN

## 2019-11-04 MED ORDER — WITCH HAZEL-GLYCERIN EX PADS: 1.0000 "application " | MEDICATED_PAD | CUTANEOUS | Status: DC | PRN

## 2019-11-04 MED ORDER — BUPIVACAINE HCL (PF) 0.25 % IJ SOLN
INTRAMUSCULAR | Status: DC | PRN
  Administered 2019-11-04: 7 mg

## 2019-11-04 MED ORDER — ACETAMINOPHEN 500 MG PO TABS
1000.0000 mg | ORAL_TABLET | Freq: Four times a day (QID) | ORAL | Status: AC
  Administered 2019-11-04 – 2019-11-05 (×4): 1000 mg via ORAL
  Filled 2019-11-04 (×4): qty 2

## 2019-11-04 MED ORDER — LACTATED RINGERS IV SOLN: 500.0000 mL | Freq: Once | INTRAVENOUS | Status: DC

## 2019-11-04 MED ORDER — NALOXONE HCL 0.4 MG/ML IJ SOLN: 0.4000 mg | INTRAMUSCULAR | Status: DC | PRN

## 2019-11-04 MED ORDER — DIPHENHYDRAMINE HCL 25 MG PO CAPS: 25.0000 mg | ORAL_CAPSULE | Freq: Four times a day (QID) | ORAL | Status: DC | PRN

## 2019-11-04 MED ORDER — LIDOCAINE HCL (PF) 1 % IJ SOLN: 30.0000 mL | INTRAMUSCULAR | Status: DC | PRN

## 2019-11-04 SURGICAL SUPPLY — 46 items
APL SKNCLS STERI-STRIP NONHPOA (GAUZE/BANDAGES/DRESSINGS) ×1
BARRIER ADHS 3X4 INTERCEED (GAUZE/BANDAGES/DRESSINGS) ×3 IMPLANT
BENZOIN TINCTURE PRP APPL 2/3 (GAUZE/BANDAGES/DRESSINGS) ×2 IMPLANT
BRR ADH 4X3 ABS CNTRL BYND (GAUZE/BANDAGES/DRESSINGS) ×1
CHLORAPREP W/TINT 26ML (MISCELLANEOUS) ×3 IMPLANT
CLAMP CORD UMBIL (MISCELLANEOUS) IMPLANT
CLOSURE WOUND 1/2 X4 (GAUZE/BANDAGES/DRESSINGS) ×1
CLOTH BEACON ORANGE TIMEOUT ST (SAFETY) ×3 IMPLANT
DRAPE C SECTION CLR SCREEN (DRAPES) ×3 IMPLANT
DRSG OPSITE POSTOP 4X10 (GAUZE/BANDAGES/DRESSINGS) ×3 IMPLANT
ELECT REM PT RETURN 9FT ADLT (ELECTROSURGICAL) ×3
ELECTRODE REM PT RTRN 9FT ADLT (ELECTROSURGICAL) ×1 IMPLANT
EXTRACTOR VACUUM M CUP 4 TUBE (SUCTIONS) IMPLANT
EXTRACTOR VACUUM M CUP 4' TUBE (SUCTIONS)
GLOVE BIOGEL PI IND STRL 7.0 (GLOVE) ×2 IMPLANT
GLOVE BIOGEL PI INDICATOR 7.0 (GLOVE) ×4
GLOVE ECLIPSE 6.5 STRL STRAW (GLOVE) ×3 IMPLANT
GOWN STRL REUS W/TWL LRG LVL3 (GOWN DISPOSABLE) ×6 IMPLANT
KIT ABG SYR 3ML LUER SLIP (SYRINGE) IMPLANT
NDL HYPO 25X5/8 SAFETYGLIDE (NEEDLE) IMPLANT
NEEDLE HYPO 22GX1.5 SAFETY (NEEDLE) ×3 IMPLANT
NEEDLE HYPO 25X5/8 SAFETYGLIDE (NEEDLE) IMPLANT
NS IRRIG 1000ML POUR BTL (IV SOLUTION) ×3 IMPLANT
PACK C SECTION WH (CUSTOM PROCEDURE TRAY) ×3 IMPLANT
PAD OB MATERNITY 4.3X12.25 (PERSONAL CARE ITEMS) ×3 IMPLANT
RTRCTR C-SECT PINK 25CM LRG (MISCELLANEOUS) IMPLANT
STRIP CLOSURE SKIN 1/2X4 (GAUZE/BANDAGES/DRESSINGS) ×1 IMPLANT
SUT CHROMIC GUT AB #0 18 (SUTURE) IMPLANT
SUT MNCRL 0 VIOLET CTX 36 (SUTURE) ×3 IMPLANT
SUT MON AB 2-0 SH 27 (SUTURE)
SUT MON AB 2-0 SH27 (SUTURE) IMPLANT
SUT MON AB 3-0 SH 27 (SUTURE)
SUT MON AB 3-0 SH27 (SUTURE) IMPLANT
SUT MON AB 4-0 PS1 27 (SUTURE) IMPLANT
SUT MONOCRYL 0 CTX 36 (SUTURE) ×6
SUT PLAIN 2 0 (SUTURE)
SUT PLAIN 2 0 XLH (SUTURE) IMPLANT
SUT PLAIN ABS 2-0 CT1 27XMFL (SUTURE) IMPLANT
SUT VIC AB 0 CT1 36 (SUTURE) ×6 IMPLANT
SUT VIC AB 2-0 CT1 27 (SUTURE) ×3
SUT VIC AB 2-0 CT1 TAPERPNT 27 (SUTURE) ×1 IMPLANT
SUT VIC AB 4-0 PS2 27 (SUTURE) IMPLANT
SYR CONTROL 10ML LL (SYRINGE) ×3 IMPLANT
TOWEL OR 17X24 6PK STRL BLUE (TOWEL DISPOSABLE) ×3 IMPLANT
TRAY FOLEY W/BAG SLVR 14FR LF (SET/KITS/TRAYS/PACK) IMPLANT
WATER STERILE IRR 1000ML POUR (IV SOLUTION) ×3 IMPLANT

## 2019-11-04 NOTE — Progress Notes (Signed)
S: comfortable Epidural O: VS: BP 150/90 VE per RN 5-6/80/0  Tracing baseline 150 with repetitive deep variables Ctx q 3- 18mins Covid -19 testing still pending Amnioinfusion ongoing  IMP: fetal intolerance to labor with slow labor progression due to sub optimal labor. Unable to augment due to fetal tracing and with  poor uterine contraction force not expecting labor progression without fetal compromise. Did not respond to amnioinfusion IUP@ 38 3/7 week P) proceed with primary C/S. Pt agrees. Risk of surgery reviewed including infection, bleeding, poss need for blood transfusion and its risk, injury to surrounding organ structures. All ? Answered. Due to unknown Covid -19 status, husband and doula notified will not be in the OR with pt. OR notified

## 2019-11-04 NOTE — Anesthesia Procedure Notes (Signed)
Epidural Patient location during procedure: OB Start time: 11/04/2019 2:23 AM End time: 11/04/2019 2:33 AM  Staffing Anesthesiologist: Pervis Hocking, DO Performed: anesthesiologist   Preanesthetic Checklist Completed: patient identified, pre-op evaluation, timeout performed, IV checked, risks and benefits discussed and monitors and equipment checked  Epidural Patient position: sitting Prep: site prepped and draped and DuraPrep Patient monitoring: continuous pulse ox, blood pressure, heart rate and cardiac monitor Approach: midline Location: L3-L4 Injection technique: LOR air  Needle:  Needle type: Tuohy  Needle gauge: 17 G Needle length: 9 cm Needle insertion depth: 5 cm Catheter type: closed end flexible Catheter size: 19 Gauge Catheter at skin depth: 10 cm Test dose: negative  Assessment Sensory level: T8 Events: blood not aspirated, injection not painful, no injection resistance, negative IV test and no paresthesia  Additional Notes Patient identified. Risks/Benefits/Options discussed with patient including but not limited to bleeding, infection, nerve damage, paralysis, failed block, incomplete pain control, headache, blood pressure changes, nausea, vomiting, reactions to medication both or allergic, itching and postpartum back pain. Confirmed with bedside nurse the patient's most recent platelet count. Confirmed with patient that they are not currently taking any anticoagulation, have any bleeding history or any family history of bleeding disorders. Patient expressed understanding and wished to proceed. All questions were answered. Sterile technique was used throughout the entire procedure. Please see nursing notes for vital signs. Test dose was given through epidural catheter and negative prior to continuing to dose epidural or start infusion. Warning signs of high block given to the patient including shortness of breath, tingling/numbness in hands, complete motor  block, or any concerning symptoms with instructions to call for help. Patient was given instructions on fall risk and not to get out of bed. All questions and concerns addressed with instructions to call with any issues or inadequate analgesia.  Reason for block:procedure for pain

## 2019-11-04 NOTE — Lactation Note (Signed)
This note was copied from a baby's chart. Lactation Consultation Note  Patient Name: Bethany Campos [id number] Date: 11/04/2019 Reason for consult: Early term 37-38.6wks;Primapara;1st time breastfeeding  P1 mother whose infant is now 76 hours old.  This is an ETI at 38+3 weeks. Baby is DAT+.  Baby was asleep in mother's arms when I arrived.  Discussed basic breast feeding concepts with parents.  Many topics discussed including STS, latching, feeding cues, hand expression, expectations with an ETI and increased jaundice possibility due to DAT+.  Parents are very receptive to learning and work well together.    Encouraged to feed at least every three hours due to baby's gestational age and increased possibility of jaundice.  Reviewed feeding cues with parents.  Suggested mother practice hand expression before/after feedings to help increase milk supply.  Colostrum container provided and milk storage times reviewed.  Finger feeding demonstrated.  Mother also familiar with spoon feeding.  Offered to initiate the DEBP due to early gestational age and for supplementation.  Mother very interested and welcomed the suggestion.  Pump parts, assembly, disassembly and cleaning reviewed.  #24 flange size is appropriate at this time.  Observed mother pumping for the entire 15 minutes while continuing to educate.  Mother was able to obtain 8 mls of EBM with her first pumping session.  Praised her efforts and she was so happy to see this.  Demonstrated feeding using the curved tip syringe and baby tolerated this very well.  Mother burped after feeding.  Father interested in observing swaddling.  Demonstrated swaddling and placed baby in mother's arms where she remained awake and content.  Mother will breast feed followed by supplementation with EBM and will post pump for 15 minutes after every feeding tonight.  She will call for latch assistance as needed.  Mother's breasts are soft and non tender and nipples are  everted and intact.    Mom made aware of O/P services, breastfeeding support groups, community resources, and our phone # for post-discharge questions. Mother has a DEBP for home use.  Father supportive.  RN updated.    Maternal Data Formula Feeding for Exclusion: No Has patient been taught Hand Expression?: Yes Does the patient have breastfeeding experience prior to this delivery?: No  Feeding    LATCH Score                   Interventions    Lactation Tools Discussed/Used Tools: Pump Breast pump type: Double-Electric Breast Pump WIC Program: No Pump Review: Setup, frequency, and cleaning;Milk Storage Initiated by:: Paul Dykes Date initiated:: 11/05/19   Consult Status Consult Status: Follow-up Date: 11/05/19 Follow-up type: In-patient    [name] 11/04/2019, 7:09 PM

## 2019-11-04 NOTE — H&P (Signed)
Bethany Campos is a 33 y.o. female presenting @ 79 3/7 weeks with SROM @ 12 am. (+) ctx. Pt was seen 11/16 in [place] for 5 + hours without marked cervical changes OB History    Gravida  2   Para  0   Term  0   Preterm  0   AB  1   Living  0     SAB  1   TAB  0   Ectopic  0   Multiple  0   Live Births             Past Medical History:  Diagnosis Date  . Dysmenorrhea   . Elevated cholesterol   . 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 Scoliosis   . Vitamin D deficiency    Past Surgical History:  Procedure Laterality Date  . MOUTH SURGERY  age 4   wisdom teeth upper age 80, lower age 11   Family History: family history includes Breast cancer (age of onset: 59) in her paternal aunt; Diabetes in her father; Heart failure in her paternal grandfather; Parkinson's disease in her mother. Social History:  reports that she has never smoked. She has never used smokeless tobacco. She reports that she does not drink alcohol or use drugs.     Maternal Diabetes: No Genetic Screening: Normal Maternal Ultrasounds/Referrals: Normal Fetal Ultrasounds or other Referrals:  None Maternal Substance Abuse:  No Significant Maternal Medications:  None Significant Maternal Lab Results:  Group B Strep negative and Rh negative Other Comments:  None  Review of Systems  All other systems reviewed and are negative.  History Dilation: 3 Effacement (%): 80 Station: 0 Exam by:: [employer], RN Blood pressure 134/85, pulse (!) 105, temperature 98.3 F (36.8 C), temperature source Oral, resp. rate 18, last menstrual period 02/08/2019, SpO2 99 %. Exam Physical Exam  Constitutional: She is oriented to person, place, and time. She appears well-developed and well-nourished.  Cardiovascular: Regular rhythm.  Respiratory: Effort normal.  GI: Soft.  Neurological: She is alert and oriented to person, place, and time.  Skin: Skin is warm and dry.     Prenatal labs: ABO, Rh:  O negative Antibody:  neg Rubella:  Immune RPR:   NR HBsAg:   neg HIV:   neg GBS:   negative  Assessment/Plan: SROM Latent phase labor IUP@ 38 3/7 Rh negative P) admit  Routine labs. Watch tracing closely. Await active labor  Bethany Campos A Bethany Campos 11/04/2019, 1:28 AM

## 2019-11-04 NOTE — Progress Notes (Signed)
POSTOPERATIVE DAY # 0 S/P Primary LTCS for fetal intolerance to labor, baby girl - interval visit    S:         Mom asleep; dad holding infant    O:  VS: BP 121/79 (BP Location: Right Arm)   Pulse 91   Temp 98.9 F (37.2 C) (Oral)   Resp 18   Ht 5\' 7"  (1.702 m)   Wt 70.9 kg   LMP 02/08/2019 (Exact Date)   SpO2 98%   Breastfeeding Unknown   BMI 24.48 kg/m  11/04/19 0930  98.9 F (37.2 C)  91  -  18  121/79  Semi-fowlers  98 %  -  -  -  - ED   11/04/19 0813  98.3 F (36.8 C)  78  -  17  139/82  Semi-fowlers  98 %  -  -  -  - ED   11/04/19 0649  98.4 F (36.9 C)  92  -  18  137/82  Semi-fowlers  100 %  -  -  -  - JL   11/04/19 0615  -  87  87  20  131/90  -  98 %  -  -  -  - MR   11/04/19 0600  -  99  100  16  144/82Abnormal   -  97 %  -  -  -  - MR   11/04/19 0545  -  106Abnormal   -  14  135/94Abnormal   -  -  -  -  -  - MR   11/04/19 0530  -  100  -  16  157/85Abnormal   -  -  -  -  -  - MR   11/04/19 0526  -  103Abnormal   -  15  130/96Abnormal   -  -  -  -  -  - MR   11/04/19 0410  -  125Abnormal   -  -  150/90Abnormal   -  -  -  -  -  - MR      LABS:               Recent Labs    11/03/19 1316 11/04/19 0100  WBC 21.6* 24.2*  HGB 13.9 13.9  PLT 227 240               Bloodtype: --/--/O NEG (11/17 0040)  Rubella:                                               I&O: Intake/Output      11/16 0701 - 11/17 0700 11/17 0701 - 11/18 0700   I.V. (mL/kg) 1700 (24)    Total Intake(mL/kg) 1700 (24)    Urine (mL/kg/hr) 1700    Blood 278    Total Output 1978    Net -278                      Physical Exam:             Deferred  A/P:    POD # 0 S/P Primary LTCS            RH negative, Baby A Positive   - Rhogam today   Leukocytosis    - CBC in AM  Gestation hypertension   -  BPs stable since delivery    - normal PIH labs on admission    - Add repeat CMP to tomorrow AM labs  Routine postoperative care              Will round in AM  Lars Pinks, MSN,  CNM [facility]

## 2019-11-04 NOTE — Progress Notes (Signed)
S; epidural  O: BP 114/69   Pulse 89   Temp 98.9 F (37.2 C) (Oral)   Resp 18   LMP 02/08/2019 (Exact Date)   SpO2 99%  Ve deferred  Tracing: baseline 140 (+) variables   ctx suboptimal  IMP: active phase SROM  variable decelerations due to presumed cord compression P) amnioinfusion. With improved tracing, pitocin augmentation

## 2019-11-04 NOTE — Anesthesia Postprocedure Evaluation (Signed)
Anesthesia Post Note  Patient: Bethany Campos  Procedure(s) Performed: CESAREAN SECTION (N/A )     Patient location during evaluation: PACU Anesthesia Type: Epidural Level of consciousness: awake and alert Pain management: pain level controlled Vital Signs Assessment: post-procedure vital signs reviewed and stable Respiratory status: spontaneous breathing, nonlabored ventilation and respiratory function stable Cardiovascular status: stable Postop Assessment: no headache, no backache, epidural receding, patient able to bend at knees and no apparent nausea or vomiting Anesthetic complications: no    Last Vitals:  Vitals:   11/04/19 0600 11/04/19 0615  BP: (!) 144/82 131/90  Pulse: 99 87  Resp: 16 20  Temp:    SpO2: 97% 98%    Last Pain:  Vitals:   11/04/19 0530  TempSrc:   PainSc: 0-No pain   Pain Goal:                Epidural/Spinal Function Cutaneous sensation: Pins and Needles (11/04/19 0615), Patient able to flex knees: Yes (11/04/19 0615), Patient able to lift hips off bed: Yes (11/04/19 0615), Back pain beyond tenderness at insertion site: No (11/04/19 0615), Progressively worsening motor and/or sensory loss: No (11/04/19 0615), Bowel and/or bladder incontinence post epidural: No (11/04/19 0615)  Pervis Hocking

## 2019-11-04 NOTE — Transfer of Care (Signed)
Immediate Anesthesia Transfer of Care Note  Patient: Bethany Campos  Procedure(s) Performed: CESAREAN SECTION (N/A )  Patient Location: PACU  Anesthesia Type:Epidural  Level of Consciousness: awake, alert  and patient cooperative  Airway & Oxygen Therapy: Patient Spontanous Breathing  Post-op Assessment: Report given to RN and Post -op Vital signs reviewed and stable  Post vital signs: Reviewed and stable  Last Vitals:  Vitals Value Taken Time  BP    Temp    Pulse    Resp    SpO2      Last Pain:  Vitals:   11/04/19 0130  TempSrc: Oral  PainSc: 10-Worst pain ever         Complications: No apparent anesthesia complications

## 2019-11-04 NOTE — Progress Notes (Signed)
S; breathing/moaning with ctx  O: BP 134/85   Pulse (!) 105   Temp 98.3 F (36.8 C) (Oral)   Resp 18   LMP 02/08/2019 (Exact Date)   SpO2 99%  VE 4/80/0 station  (+) bloody fluid ISE/IUPC placed due to variable vs late decel with external monitoring  Tracing; baseline 140 (+) artifact on internal scalp electrode ? Early decel Ctx q 3 3 1/2 mins  IMP: active phase IUP@ 38 3/7 week P) epidural cont to get tracing

## 2019-11-04 NOTE — Brief Op Note (Signed)
11/04/2019  5:11 AM  PATIENT:  Rafael Bihari  33 y.o. female  PRE-OPERATIVE DIAGNOSIS:  Fetal intolerance to labor, IUP @ 38 3/7 wk  POST-OPERATIVE DIAGNOSIS:  same  PROCEDURE:  Primary Cesarean section, kerr hysterotomy  SURGEON:  Surgeon(s) and Role:    * Servando Salina, MD - Primary  PHYSICIAN ASSISTANT:   ASSISTANTS: Artelia Laroche, CNM   ANESTHESIA:   epidural FINDINGS;  LIVE FEMALE CORD around right leg, nl tubes and ovaries EBL: 450 ml   BLOOD ADMINISTERED:none  DRAINS: none   LOCAL MEDICATIONS USED:  MARCAINE     SPECIMEN:  Source of Specimen:  placenta  DISPOSITION OF SPECIMEN:  PATHOLOGY  COUNTS:  YES  TOURNIQUET:  * No tourniquets in log *  DICTATION: .Other Dictation: Dictation Number [id number]  PLAN OF CARE: Admit to inpatient   PATIENT DISPOSITION:  PACU - hemodynamically stable.   Delay start of Pharmacological VTE agent (>24hrs) due to surgical blood loss or risk of bleeding: no

## 2019-11-04 NOTE — Op Note (Signed)
Bethany Campos, Bethany Campos MEDICAL RECORD [id number] ACCOUNT [id number] DATE OF BIRTH:Oct 03, 1986 FACILITY: [facility] LOCATION: [facility] PHYSICIAN:Lyrah Bradt A. Sherea Liptak, MD  OPERATIVE REPORT  DATE OF PROCEDURE:  11/04/2019  PREOPERATIVE DIAGNOSIS:  Fetal intolerance to labor, intrauterine gestation at 94 and 3/7 weeks.  PROCEDURE:  Primary cesarean section, Kerr hysterotomy.  POSTOPERATIVE DIAGNOSIS:  Fetal intolerance to labor, intrauterine gestation at 52 and 3/7 weeks.  ANESTHESIA:  Epidural.  SURGEON:  Servando Salina, MD  ASSISTANT:  Artelia Laroche, CNM.  DESCRIPTION OF PROCEDURE:  Under adequate epidural anesthesia, the patient was placed in the supine position with a left lateral tilt.  She was sterilely prepped and draped in usual fashion.  Indwelling Foley catheter was sterilely placed.  Marcaine 0.25% was injected along the planned Pfannenstiel skin incision site.  Pfannenstiel skin incision was then made, carried down to the rectus fascia.  The rectus fascia was opened transversely.  Rectus fascia was then bluntly and sharply dissected off the  rectus muscle in a superior and inferior fashion.  The rectus muscle split in the midline.  The parietal peritoneum was entered sharply and extended.  A self-retaining Alexis retractor was then placed.  Vesicouterine peritoneum was opened transversely.   The bladder was bluntly dissected off the lower uterine segment and displaced inferiorly.  A curvilinear low transverse uterine incision was then made and extended bluntly in a cephalic and caudad manner.  Subsequent delivery of a live female from the  right occiput anterior position with a cord around the foot was accomplished.  The baby was bulb suctioned on the abdomen.  Cord was delayed clamped x1 minute.  The Apgars were assigned of 8 and 9 at 1 and 5 minutes respectively by the awaiting  pediatricians.  Placenta was spontaneous intact, sent to pathology due to the vaginal bleeding that  was noted during delivery.  The uterine cavity was cleaned of debris.  Uterine incision had no extension.  The uterus was exteriorized.  Normal tubes and ovaries were noted bilaterally.  Uterine incision was closed in 2 layers, the first layer of 0 Monocryl running lock stitch, second layer was imbricating using 0 Monocryl suture.  The abdomen was irrigated and suctioned of debris.  The Alexis  retractor was removed.  Interceed was placed in an inverted T fashion overlying the incision.  The parietal peritoneum was closed with 2-0 Vicryl.  The rectus fascia was closed with 0 Vicryl x2.  The subcutaneous area was irrigated, small bleeders cauterized.  Interrupted 2-0 plain sutures placed and the skin approximated using 4-0 Vicryl subcuticular closure and Steri-Strips and benzoin was placed.  SPECIMEN:  Placenta sent to pathology.  ESTIMATED BLOOD LOSS:  278 mL.  INTRAOPERATIVE FLUIDS:  1100 mL.  URINE OUTPUT:  150 mL clear yellow urine.  COUNTS:  Sponge and instrument counts x2 was correct.  COMPLICATIONS:  None.  DISPOSITION:  The patient tolerated the procedure well and was transferred to recovery room in stable condition.  TN/NUANCE  D:11/04/2019 T:11/04/2019 JOB:[id number]

## 2019-11-04 NOTE — Anesthesia Preprocedure Evaluation (Signed)
Anesthesia Evaluation  Patient identified by MRN, date of birth, ID band Patient awake    Reviewed: Allergy & Precautions, NPO status , Patient's Chart, lab work & pertinent test results  Airway Mallampati: II  TM Distance: >3 FB Neck ROM: Full    Dental no notable dental hx.    Pulmonary neg pulmonary ROS,    Pulmonary exam normal breath sounds clear to auscultation       Cardiovascular negative cardio ROS Normal cardiovascular exam Rhythm:Regular Rate:Normal     Neuro/Psych negative neurological ROS  negative psych ROS   GI/Hepatic negative GI ROS, Neg liver ROS,   Endo/Other  negative endocrine ROS  Renal/GU negative Renal ROS   Dysmenorrhea    Musculoskeletal scoliosis   Abdominal   Peds negative pediatric ROS (+)  Hematology negative hematology ROS (+)   Anesthesia Other Findings   Reproductive/Obstetrics (+) Pregnancy                             Anesthesia Physical Anesthesia Plan  ASA: II and emergent  Anesthesia Plan: Epidural   Post-op Pain Management:    Induction:   PONV Risk Score and Plan: 2  Airway Management Planned: Natural Airway  Additional Equipment: None  Intra-op Plan:   Post-operative Plan:   Informed Consent: I have reviewed the patients History and Physical, chart, labs and discussed the procedure including the risks, benefits and alternatives for the proposed anesthesia with the patient or authorized representative who has indicated his/her understanding and acceptance.       Plan Discussed with:   Anesthesia Plan Comments:         Anesthesia Quick Evaluation

## 2019-11-04 NOTE — MAU Note (Signed)
PT SAYS AT 2355 SROM  WITH BLOODY SHOW.  WAS HERE AT 6 PM- VE WAS  4 CM.  GBS- NEG .

## 2019-11-05 ENCOUNTER — Encounter (HOSPITAL_COMMUNITY): Payer: Self-pay | Admitting: Obstetrics and Gynecology

## 2019-11-05 DIAGNOSIS — Z6791 Unspecified blood type, Rh negative: Secondary | ICD-10-CM

## 2019-11-05 DIAGNOSIS — O139 Gestational [pregnancy-induced] hypertension without significant proteinuria, unspecified trimester: Secondary | ICD-10-CM | POA: Diagnosis present

## 2019-11-05 LAB — COMPREHENSIVE METABOLIC PANEL
ALT: 15 U/L (ref 0–44)
AST: 23 U/L (ref 15–41)
Albumin: 2 g/dL — ABNORMAL LOW (ref 3.5–5.0)
Alkaline Phosphatase: 107 U/L (ref 38–126)
Anion gap: 9 (ref 5–15)
BUN: 8 mg/dL (ref 6–20)
CO2: 24 mmol/L (ref 22–32)
Calcium: 8.6 mg/dL — ABNORMAL LOW (ref 8.9–10.3)
Chloride: 106 mmol/L (ref 98–111)
Creatinine, Ser: 0.74 mg/dL (ref 0.44–1.00)
GFR calc Af Amer: >60 mL/min (ref >60)
GFR calc non Af Amer: >60 mL/min (ref >60)
Glucose, Bld: 83 mg/dL (ref 70–99)
Potassium: 3.8 mmol/L (ref 3.5–5.1)
Sodium: 139 mmol/L (ref 135–145)
Total Bilirubin: 0.3 mg/dL (ref 0.3–1.2)
Total Protein: 4.7 g/dL — ABNORMAL LOW (ref 6.5–8.1)

## 2019-11-05 LAB — CBC
HCT: 32.5 % — ABNORMAL LOW (ref 36.0–46.0)
Hemoglobin: 11.2 g/dL — ABNORMAL LOW (ref 12.0–15.0)
MCH: 31.5 pg (ref 26.0–34.0)
MCHC: 34.5 g/dL (ref 30.0–36.0)
MCV: 91.3 fL (ref 80.0–100.0)
Platelets: 162 10*3/uL (ref 150–400)
RBC: 3.56 MIL/uL — ABNORMAL LOW (ref 3.87–5.11)
RDW: 13.7 % (ref 11.5–15.5)
WBC: 16.4 10*3/uL — ABNORMAL HIGH (ref 4.0–10.5)
nRBC: 0 % (ref 0.0–0.2)

## 2019-11-05 LAB — SURGICAL PATHOLOGY

## 2019-11-05 MED ORDER — RHO D IMMUNE GLOBULIN 1500 UNIT/2ML IJ SOSY
300.0000 ug | PREFILLED_SYRINGE | Freq: Once | INTRAMUSCULAR | Status: AC
  Administered 2019-11-05: 300 ug via INTRAVENOUS
  Filled 2019-11-05: qty 2

## 2019-11-05 NOTE — Progress Notes (Signed)
Patient ID: Bethany Campos, female   DOB: 11/04/1986, 33 y.o.   MRN: [id number] Subjective: POD# 1 Live born female  Birth Weight: 7 lb (3175 g) APGAR: 8, 9  Newborn Delivery   Birth date/time: 11/04/2019 04:40:00 Delivery type: C-Section, Low Transverse Trial of labor: No C-section categorization: Primary     Baby name: Leonides Sake Delivering provider: COUSINS, Alanda Slim   Feeding: breast  Pain control at delivery: Epidural   Reports feeling well.  Patient reports tolerating PO.   Breast symptoms: + colostrum Pain controlled with PO meds Denies HA/SOB/C/P/N/V/dizziness. Flatus present. She reports vaginal bleeding as normal, without clots.  She is ambulating, urinating without difficulty.     Objective:   VS:    Vitals:   11/04/19 1926 11/04/19 2359 11/05/19 0257 11/05/19 0520  BP: 123/64 113/71 119/82 113/70  Pulse: 78 90 79 73  Resp: 18 18 18 18   Temp: 98.2 F (36.8 C) 98.1 F (36.7 C) 98.3 F (36.8 C) 98.3 F (36.8 C)  TempSrc: Oral Oral Oral Oral  SpO2: 98% 99% 97% 98%  Weight:      Height:          Intake/Output Summary (Last 24 hours) at 11/05/2019 [id number] Last data filed at 11/05/2019 0700 Gross per 24 hour  Intake -  Output 2650 ml  Net -2650 ml        Recent Labs    11/04/19 0100 11/05/19 0618  WBC 24.2* 16.4*  HGB 13.9 11.2*  HCT 39.8 32.5*  PLT 240 162   CMP     Component Value Date/Time   NA 139 11/05/2019 0618   NA 140 03/13/2018 1330   K 3.8 11/05/2019 0618   CL 106 11/05/2019 0618   CO2 24 11/05/2019 0618   GLUCOSE 83 11/05/2019 0618   BUN 8 11/05/2019 0618   BUN 10 03/13/2018 1330   CREATININE 0.74 11/05/2019 0618   CREATININE 0.67 01/25/2015 1325   CALCIUM 8.6 (L) 11/05/2019 0618   PROT 4.7 (L) 11/05/2019 0618   PROT 6.9 03/13/2018 1330   ALBUMIN 2.0 (L) 11/05/2019 0618   ALBUMIN 4.5 03/13/2018 1330   AST 23 11/05/2019 0618   ALT 15 11/05/2019 0618   ALKPHOS 107 11/05/2019 0618   BILITOT 0.3 11/05/2019 0618   BILITOT  <0.2 03/13/2018 1330   GFRNONAA >60 11/05/2019 0618   GFRAA >60 11/05/2019 0618     Blood type: --/--/O NEG (11/18 0618)  Rubella:   immune Vaccines: TDaP UTD         Flu    UTD   Physical Exam:  General: alert, cooperative and no distress CV: Regular rate and rhythm Resp: clear Abdomen: soft, nontender, normal bowel sounds Incision: clean, dry and intact Uterine Fundus: firm, below umbilicus, nontender Lochia: minimal Ext: no edema, redness or tenderness in the calves or thighs      Assessment/Plan: 33 y.o.   POD# 1. [id number]                  Principal Problem:   Postpartum care following cesarean delivery (11/17)  - fetal intolerance to labor Active Problems:   Indication for care in labor or delivery   Gestational hypertension  - stable labs and BP, no PEC neural sx.   Rh negative, maternal  - infant Rh positive  - Rhogam prior to DC  Doing well, stable.               Advance diet as tolerated Encourage rest when baby  rests Breastfeeding support Encourage to ambulate Routine post-op care  Juliene Pina, CNM, MSN 11/05/2019, 9:22 AM

## 2019-11-06 LAB — RH IG WORKUP (INCLUDES ABO/RH)
ABO/RH(D): O NEG
Fetal Screen: NEGATIVE
Gestational Age(Wks): 38
Unit division: 0

## 2019-11-06 MED ORDER — OXYCODONE HCL 5 MG PO TABS: 5.0000 mg | ORAL_TABLET | ORAL | 0 refills | Status: DC | PRN

## 2019-11-06 MED ORDER — SENNOSIDES-DOCUSATE SODIUM 8.6-50 MG PO TABS: 2.0000 | ORAL_TABLET | ORAL | Status: DC

## 2019-11-06 MED ORDER — IBUPROFEN 800 MG PO TABS: 800.0000 mg | ORAL_TABLET | Freq: Four times a day (QID) | ORAL | 0 refills | Status: DC

## 2019-11-06 MED ORDER — COCONUT OIL OIL: 1.0000 "application " | TOPICAL_OIL | 0 refills | Status: DC | PRN

## 2019-11-06 MED ORDER — SIMETHICONE 80 MG PO CHEW: 80.0000 mg | CHEWABLE_TABLET | ORAL | 0 refills | Status: DC | PRN

## 2019-11-06 NOTE — Lactation Note (Signed)
This note was copied from a baby's chart. Lactation Consultation Note  Patient Name: Bethany Campos [id number] Date: 11/06/2019   Randel Books is 51 hours old.  [redacted]w[redacted]d.  Mother has good milk supply and denies concerns. Mother recently pumped 19 ml.  Praised mother to efforts.  Discussed pumping frequency.  Suggest pumping only 1-2 times a day only if desired since baby is breastfeeding well and too much pumping will lead to oversupply. Mother breastfed baby for 30 min and then FOB tried to feed bottle but baby was content. Reviewed milk storage. Reviewed engorgement care and monitoring voids/stools.      Maternal Data    Feeding    LATCH Score                   Interventions    Lactation Tools Discussed/Used     Consult Status      Vivianne Master [facility] 11/06/2019, 11:08 AM

## 2019-11-06 NOTE — Discharge Summary (Signed)
OB Discharge Summary  Patient Name: Bethany Campos DOB: January 29, 1986 MRN: [id number]  Date of admission: 11/04/2019 Delivering provider: COUSINS, Alanda Slim   Date of discharge: 11/06/2019  Admitting diagnosis: 25.3WKS WATER BROKE Intrauterine pregnancy: [redacted]w[redacted]d     Secondary diagnosis:Principal Problem:   Postpartum care following cesarean delivery (11/17) Active Problems:   Indication for care in labor or delivery   Gestational hypertension   Rh negative, maternal  Additional problems:none     Discharge diagnosis:  Patient Active Problem List   Diagnosis Date Noted  . Gestational hypertension 11/05/2019  . Rh negative, maternal 11/05/2019  . Indication for care in labor or delivery 11/04/2019  . Postpartum care following cesarean delivery (11/17) 11/04/2019                                                                Post partum procedures:rhogam  Augmentation: Pitocin Pain control: Epidural  Laceration:None  Episiotomy:None  Complications: None   Hospital course:  Onset of Labor With Unplanned C/S  33 y.o. yo G2P1011 at [redacted]w[redacted]d was admitted in Latent Labor on 11/04/2019. Patient had a labor course significant for placement of internal monitors for uterine pressures and fetal tracing. Amnioinfusion performed for repetitive variable decelerations in fetal heart tracing. Membrane Rupture Time/Date: 11:55 PM ,11/03/2019   The patient went for cesarean section due to Non-Reassuring FHR, and delivered a Viable infant,11/04/2019  Details of operation can be found in separate operative note. Patient had an uncomplicated postpartum course.  She is ambulating,tolerating a regular diet, passing flatus, and urinating well.  Patient is discharged home in stable condition 11/06/19.  Physical exam  Vitals:   11/05/19 0520 11/05/19 1420 11/05/19 2216 11/06/19 0519  BP: 113/70 123/80 115/76 128/79  Pulse: 73 86 75 74  Resp: 18 16 19 18   Temp: 98.3 F (36.8 C) 97.8 F (36.6 C)  98.9 F (37.2 C) 98.3 F (36.8 C)  TempSrc: Oral  Oral Oral  SpO2: 98% 98%  99%  Weight:      Height:       General: alert, cooperative and no distress Lochia: appropriate Uterine Fundus: firm Incision: Healing well with no significant drainage DVT Evaluation: No cords or calf tenderness. No significant calf/ankle edema. Labs: Lab Results  Component Value Date   WBC 16.4 (H) 11/05/2019   HGB 11.2 (L) 11/05/2019   HCT 32.5 (L) 11/05/2019   MCV 91.3 11/05/2019   PLT 162 11/05/2019   CMP Latest Ref Rng & Units 11/05/2019  Glucose 70 - 99 mg/dL 83  BUN 6 - 20 mg/dL 8  Creatinine 0.44 - 1.00 mg/dL 0.74  Sodium 135 - 145 mmol/L 139  Potassium 3.5 - 5.1 mmol/L 3.8  Chloride 98 - 111 mmol/L 106  CO2 22 - 32 mmol/L 24  Calcium 8.9 - 10.3 mg/dL 8.6(L)  Total Protein 6.5 - 8.1 g/dL 4.7(L)  Total Bilirubin 0.3 - 1.2 mg/dL 0.3  Alkaline Phos 38 - 126 U/L 107  AST 15 - 41 U/L 23  ALT 0 - 44 U/L 15    Vaccines: TDaP UTD         Flu    UTD  Discharge instruction: per After Visit Summary and "Baby and Me Booklet".  After Visit Meds:  Allergies as of 11/06/2019  No Known Allergies     Medication List    TAKE these medications   acetaminophen 500 MG tablet Commonly known as: TYLENOL Take 500 mg by mouth every 6 (six) hours as needed for mild pain.   calcium carbonate 500 MG chewable tablet Commonly known as: TUMS - dosed in mg elemental calcium Chew 2 tablets by mouth as needed for indigestion or heartburn.   cholecalciferol 25 MCG (1000 UT) tablet Commonly known as: VITAMIN D3 Take 1,000 Units by mouth daily.   coconut oil Oil Apply 1 application topically as needed.   ibuprofen 800 MG tablet Commonly known as: ADVIL Take 1 tablet (800 mg total) by mouth every 6 (six) hours.   Magnesium 250 MG Tabs Take 250 mg by mouth daily.   multivitamin-prenatal 27-0.8 MG Tabs tablet Take 1 tablet by mouth daily at 12 noon.   oxyCODONE 5 MG immediate release  tablet Commonly known as: Oxy IR/ROXICODONE Take 1-2 tablets (5-10 mg total) by mouth every 4 (four) hours as needed for moderate pain.   polyethylene glycol 17 g packet Commonly known as: MIRALAX / GLYCOLAX Take 17 g by mouth as needed for mild constipation.   senna-docusate 8.6-50 MG tablet Commonly known as: Senokot-S Take 2 tablets by mouth daily. Start taking on: November 07, 2019   simethicone 80 MG chewable tablet Commonly known as: MYLICON Chew 1 tablet (80 mg total) by mouth as needed for flatulence.       Diet: routine diet  Activity: Advance as tolerated. Pelvic rest for 6 weeks.   Postpartum contraception: Not Discussed  Newborn Data: Live born female  Birth Weight: 7 lb (3175 g) APGAR: 53, 78  Newborn Delivery   Birth date/time: 11/04/2019 04:40:00 Delivery type: C-Section, Low Transverse Trial of labor: No C-section categorization: Primary      named Leonides Sake Baby Feeding: Breast Disposition:home with mother   Delivery Report:  Review the Delivery Report for details.    Follow up: Follow-up Information    Azucena Fallen, MD. Schedule an appointment as soon as possible for a visit in 6 week(s).   Specialty: Obstetrics and Gynecology Contact information: [place] [address] [phone]             Signed: Otilio Carpen, MSN 11/06/2019, 12:17 PM

## 2019-11-06 NOTE — Plan of Care (Signed)
Patient appropriate for discharge.

## 2019-11-08 LAB — BPAM RBC
Blood Product Expiration Date: 202012192359
Blood Product Expiration Date: 202012202359
Unit Type and Rh: 9500
Unit Type and Rh: 9500

## 2019-11-08 LAB — TYPE AND SCREEN
ABO/RH(D): O NEG
Antibody Screen: POSITIVE
Unit division: 0
Unit division: 0

## 2019-11-11 ENCOUNTER — Other Ambulatory Visit: Payer: Self-pay

## 2019-11-11 ENCOUNTER — Encounter (HOSPITAL_COMMUNITY): Payer: Self-pay | Admitting: *Deleted

## 2019-11-11 ENCOUNTER — Inpatient Hospital Stay (HOSPITAL_COMMUNITY)
Admission: AD | Admit: 2019-11-11 | Discharge: 2019-11-13 | DRG: 776 | Disposition: A | Discharge status: Home / Self Care | Payer: BC Managed Care – PPO | Attending: Obstetrics and Gynecology | Admitting: Obstetrics and Gynecology

## 2019-11-11 DIAGNOSIS — O99345 Other mental disorders complicating the puerperium: Secondary | ICD-10-CM | POA: Diagnosis present

## 2019-11-11 DIAGNOSIS — O1415 Severe pre-eclampsia, complicating the puerperium: Secondary | ICD-10-CM | POA: Diagnosis present

## 2019-11-11 DIAGNOSIS — F419 Anxiety disorder, unspecified: Secondary | ICD-10-CM | POA: Diagnosis present

## 2019-11-11 DIAGNOSIS — R030 Elevated blood-pressure reading, without diagnosis of hypertension: Secondary | ICD-10-CM

## 2019-11-11 HISTORY — DX: Essential (primary) hypertension: I10

## 2019-11-11 LAB — COMPREHENSIVE METABOLIC PANEL
ALT: 21 U/L (ref 0–44)
AST: 19 U/L (ref 15–41)
Albumin: 2.5 g/dL — ABNORMAL LOW (ref 3.5–5.0)
Alkaline Phosphatase: 130 U/L — ABNORMAL HIGH (ref 38–126)
Anion gap: 9 (ref 5–15)
BUN: 10 mg/dL (ref 6–20)
CO2: 23 mmol/L (ref 22–32)
Calcium: 9.3 mg/dL (ref 8.9–10.3)
Chloride: 106 mmol/L (ref 98–111)
Creatinine, Ser: 0.58 mg/dL (ref 0.44–1.00)
GFR calc Af Amer: >60 mL/min (ref >60)
GFR calc non Af Amer: >60 mL/min (ref >60)
Glucose, Bld: 77 mg/dL (ref 70–99)
Potassium: 3.7 mmol/L (ref 3.5–5.1)
Sodium: 138 mmol/L (ref 135–145)
Total Bilirubin: 0.6 mg/dL (ref 0.3–1.2)
Total Protein: 6 g/dL — ABNORMAL LOW (ref 6.5–8.1)

## 2019-11-11 LAB — URINALYSIS, ROUTINE W REFLEX MICROSCOPIC
Bilirubin Urine: NEGATIVE
Glucose, UA: NEGATIVE mg/dL
Ketones, ur: NEGATIVE mg/dL
Nitrite: NEGATIVE
Protein, ur: NEGATIVE mg/dL
Specific Gravity, Urine: 1.011 (ref 1.005–1.030)
WBC, UA: >50 WBC/hpf — ABNORMAL HIGH (ref 0–5)
pH: 7 (ref 5.0–8.0)

## 2019-11-11 LAB — CBC
HCT: 35.9 % — ABNORMAL LOW (ref 36.0–46.0)
Hemoglobin: 12 g/dL (ref 12.0–15.0)
MCH: 30.4 pg (ref 26.0–34.0)
MCHC: 33.4 g/dL (ref 30.0–36.0)
MCV: 90.9 fL (ref 80.0–100.0)
Platelets: 372 10*3/uL (ref 150–400)
RBC: 3.95 MIL/uL (ref 3.87–5.11)
RDW: 13.1 % (ref 11.5–15.5)
WBC: 14.2 10*3/uL — ABNORMAL HIGH (ref 4.0–10.5)
nRBC: 0 % (ref 0.0–0.2)

## 2019-11-11 MED ORDER — NIFEDIPINE 10 MG PO CAPS
20.0000 mg | ORAL_CAPSULE | ORAL | Status: DC | PRN
  Filled 2019-11-11: qty 2

## 2019-11-11 MED ORDER — MAGNESIUM SULFATE 40 GM/1000ML IV SOLN
2.0000 g/h | INTRAVENOUS | Status: DC
  Administered 2019-11-11 – 2019-11-12 (×2): 2 g/h via INTRAVENOUS
  Filled 2019-11-11 (×2): qty 1000

## 2019-11-11 MED ORDER — MAGNESIUM SULFATE BOLUS VIA INFUSION
6.0000 g | Freq: Once | INTRAVENOUS | Status: AC
  Administered 2019-11-11: 6 g via INTRAVENOUS
  Filled 2019-11-11: qty 1000

## 2019-11-11 MED ORDER — NIFEDIPINE 10 MG PO CAPS
10.0000 mg | ORAL_CAPSULE | ORAL | Status: DC | PRN
  Administered 2019-11-11: 10 mg via ORAL
  Filled 2019-11-11: qty 1

## 2019-11-11 MED ORDER — NIFEDIPINE 10 MG PO CAPS
20.0000 mg | ORAL_CAPSULE | ORAL | Status: DC | PRN
  Administered 2019-11-11: 20 mg via ORAL

## 2019-11-11 MED ORDER — LACTATED RINGERS IV SOLN
INTRAVENOUS | Status: DC
  Administered 2019-11-12: 09:00:00 via INTRAVENOUS

## 2019-11-11 MED ORDER — NIFEDIPINE 10 MG PO CAPS: 10.0000 mg | ORAL_CAPSULE | ORAL | Status: DC | PRN

## 2019-11-11 MED ORDER — LABETALOL HCL 5 MG/ML IV SOLN: 40.0000 mg | INTRAVENOUS | Status: DC | PRN

## 2019-11-11 MED ORDER — NIFEDIPINE ER OSMOTIC RELEASE 30 MG PO TB24
30.0000 mg | ORAL_TABLET | Freq: Once | ORAL | Status: AC
  Administered 2019-11-11: 30 mg via ORAL
  Filled 2019-11-11: qty 1

## 2019-11-11 MED ORDER — NIFEDIPINE 10 MG PO CAPS: 20.0000 mg | ORAL_CAPSULE | ORAL | Status: DC | PRN

## 2019-11-11 NOTE — H&P (Signed)
Bethany Campos is a 33 y.o. G2P1011s/p c/s 11/04/19 (fetal distress) sent for evaluation from office.  Patient had severe range bp in office, then also continued with MAU evaluation despite beginning procardia protocol for bp management.  No h/o elevated bp and has checked at home and 120/80 or so.  Does have anxiety and white coat syndrome, and slightly higher during office visits but usually 130s/80s in office.  She denies any h/a, vision changes, ruq pain.   Intake/Output Summary (Last 24 hours) at 11/11/2019 1745 Last data filed at 11/11/2019 1744 Gross per 24 hour  Intake 1098.46 ml  Output 1400 ml  Net -301.54 ml     History OB History    Gravida  2   Para  1   Term  1   Preterm  0   AB  1   Living  1     SAB  1   TAB  0   Ectopic  0   Multiple  0   Live Births  1          Past Medical History:  Diagnosis Date  . Dysmenorrhea   . Elevated cholesterol   . 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 Hypertension   . Scoliosis   . Vitamin D deficiency    Past Surgical History:  Procedure Laterality Date  . CESAREAN SECTION N/A 11/04/2019   Procedure: CESAREAN SECTION;  Surgeon: Servando Salina, MD;  Location: [facility];  Service: Obstetrics;  Laterality: N/A;  . MOUTH SURGERY  age 32   wisdom teeth upper age 21, lower age 42   Family History: family history includes Breast cancer (age of onset: 71) in her paternal aunt; Diabetes in her father; Heart failure in her paternal grandfather; Parkinson's disease in her mother. Social History:  reports that she has never smoked. She has never used smokeless tobacco. She reports that she does not drink alcohol or use drugs.  ROS: See above otherwise negative   Physical Exam:    Blood pressure 130/70, pulse (!) 110, temperature 97.9 F (36.6 C), temperature source Oral, resp. rate 16, height 5\' 7"  (1.702 m), weight 64.9 kg, SpO2 99 %, unknown if currently  breastfeeding. A&O x 3 HEENT: Normal Lungs: CTAB CV: RRR Abdominal: Soft and Non-tender Lower Extremities: Non-edematous, Non-tender; dtr 2+  Labs:  CBC:  Lab Results  Component Value Date   WBC 14.2 (H) 11/11/2019   RBC 3.95 11/11/2019   HGB 12.0 11/11/2019   HCT 35.9 (L) 11/11/2019   MCV 90.9 11/11/2019   MCH 30.4 11/11/2019   MCHC 33.4 11/11/2019   RDW 13.1 11/11/2019   PLT 372 11/11/2019   CMP:  Lab Results  Component Value Date   NA 138 11/11/2019   K 3.7 11/11/2019   CL 106 11/11/2019   CO2 23 11/11/2019   GLUCOSE 77 11/11/2019   BUN 10 11/11/2019   CREATININE 0.58 11/11/2019   CALCIUM 9.3 11/11/2019   PROT 6.0 (L) 11/11/2019   AST 19 11/11/2019   ALT 21 11/11/2019   ALBUMIN 2.5 (L) 11/11/2019   ALKPHOS 130 (H) 11/11/2019   BILITOT 0.6 11/11/2019   GFRNONAA >60 11/11/2019   GFRAA >60 11/11/2019   ANIONGAP 9 11/11/2019   Urine: Lab Results  Component Value Date   COLORURINE YELLOW 11/11/2019   APPEARANCEUR HAZY (A) 11/11/2019   LABSPEC 1.011 11/11/2019   PHURINE 7.0 11/11/2019   GLUCOSEU NEGATIVE  11/11/2019   HGBUR MODERATE (A) 11/11/2019   BILIRUBINUR NEGATIVE 11/11/2019   KETONESUR NEGATIVE 11/11/2019   PROTEINUR NEGATIVE 11/11/2019   NITRITE NEGATIVE 11/11/2019   LEUKOCYTESUR LARGE (A) 11/11/2019     Assessment/Plan:  33 y.o. G2P1011   1. PP pre-eclampsia with severe features - admit for magnesium sulfate and bp management; diagnosis based on bp, though pt has responded to procardia and will continue 30mg  xl q day; will monitor I/O closely; plan 24 hr mag sulfate for seizure prophylaxis; normal labs; diagnosis and plan discussed with patient and her husband, questions answered 2. Anxiety - this could be contributing somewhat to elevated bps, but appears concomitant hypertensive d/o as well, will monitor now and post partum and treat as needed 3. Post-partum, nursing Charyl Bigger 11/11/2019, 4:10 PM

## 2019-11-11 NOTE — MAU Provider Note (Signed)
History     CSN: [id number]  Arrival date and time: 11/11/19 1113   First Provider Initiated Contact with Patient 11/11/19 1143      Chief Complaint  Patient presents with  . Hypertension   Bethany Campos is a 33 y.o. G2P1011 at ppartum s/p PCS on 11/04/2019 for fetal intolerance of labor who presents to MAU for preeclampsia evaluation after the patient took her BP at home and it was 145/90 and repeat numbers continued to be around the same. Of note, pt reports a long history of white coat syndrome and has had a blood pressure cuff at home for many years that she uses. She says her BP at home is always 120/80 or less and her BPs in the office will be in the upper 130s/80s. Pt reprots she does experience symptoms of anxiety in office and hospital settings and knows that her BP will be elevated compared to home use. Pt reports she was diagnosed with gHTN, but this was based off of her office BPs being elevated, while her home BPs were always normal during pregnancy.  Pt denies HA, blurry vision/seeing spots, N/V, epigastric pain, swelling in face and hands, sudden weight gain. Pt denies chest pain and SOB.  Pt denies constipation, diarrhea, or urinary problems. Pt denies fever, chills, fatigue, sweating or changes in appetite. Pt denies dizziness, light-headedness, weakness.  Current pregnancy problems? none Blood Type? O NEGATIVE Allergies? NKDA Current medications? Ibuprofen, PNV, VitD3, magnesium Current PNC & next appt? [facility], end of December 2020   OB History    Gravida  2   Para  1   Term  1   Preterm  0   AB  1   Living  1     SAB  1   TAB  0   Ectopic  0   Multiple  0   Live Births  1           Past Medical History:  Diagnosis Date  . Dysmenorrhea   . Elevated cholesterol   . 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 Hypertension   . Scoliosis   . Vitamin D deficiency     Past Surgical History:   Procedure Laterality Date  . CESAREAN SECTION N/A 11/04/2019   Procedure: CESAREAN SECTION;  Surgeon: Servando Salina, MD;  Location: [facility];  Service: Obstetrics;  Laterality: N/A;  . MOUTH SURGERY  age 63   wisdom teeth upper age 45, lower age 35    Family History  Problem Relation Age of Onset  . Parkinson's disease Mother   . Diabetes Father   . Breast cancer Paternal Aunt 16       bilateral mastectomy - BRCA test unknown  . Heart failure Paternal Grandfather     Social History   Tobacco Use  . Smoking status: Never Smoker  . Smokeless tobacco: Never Used  Substance Use Topics  . Alcohol use: No  . Drug use: No    Allergies: No Known Allergies  Medications Prior to Admission  Medication Sig Dispense Refill Last Dose  . acetaminophen (TYLENOL) 500 MG tablet Take 500 mg by mouth every 6 (six) hours as needed for mild pain.   Past Week at Unknown time  . calcium carbonate (TUMS - DOSED IN MG ELEMENTAL CALCIUM) 500 MG chewable tablet Chew 2 tablets by mouth as needed for indigestion or heartburn.   Past Week at Unknown  time  . cholecalciferol (VITAMIN D3) 25 MCG (1000 UT) tablet Take 1,000 Units by mouth daily.   11/11/2019 at Unknown time  . ibuprofen (ADVIL) 800 MG tablet Take 1 tablet (800 mg total) by mouth every 6 (six) hours. 30 tablet 0 11/11/2019 at Unknown time  . Magnesium 250 MG TABS Take 250 mg by mouth daily.   11/11/2019 at Unknown time  . Prenatal Vit-Fe Fumarate-FA (MULTIVITAMIN-PRENATAL) 27-0.8 MG TABS tablet Take 1 tablet by mouth daily at 12 noon.   11/11/2019 at Unknown time  . coconut oil OIL Apply 1 application topically as needed.  0   . oxyCODONE (OXY IR/ROXICODONE) 5 MG immediate release tablet Take 1-2 tablets (5-10 mg total) by mouth every 4 (four) hours as needed for moderate pain. 30 tablet 0   . polyethylene glycol (MIRALAX / GLYCOLAX) 17 g packet Take 17 g by mouth as needed for mild constipation.     . senna-docusate (SENOKOT-S) 8.6-50  MG tablet Take 2 tablets by mouth daily.     . simethicone (MYLICON) 80 MG chewable tablet Chew 1 tablet (80 mg total) by mouth as needed for flatulence. 30 tablet 0     Review of Systems  Constitutional: Negative for chills, diaphoresis, fatigue and fever.  Eyes: Negative for visual disturbance.  Respiratory: Negative for shortness of breath.   Cardiovascular: Negative for chest pain.  Gastrointestinal: Negative for abdominal pain, constipation, diarrhea, nausea and vomiting.  Genitourinary: Negative for dysuria, flank pain, frequency, pelvic pain, urgency, vaginal bleeding and vaginal discharge.  Neurological: Negative for dizziness, weakness, light-headedness and headaches.   Physical Exam   Blood pressure (!) 172/83, pulse (!) 101, temperature (!) 97.3 F (36.3 C), resp. rate 16, weight 65.6 kg, SpO2 99 %, unknown if currently breastfeeding.  Patient Vitals for the past 24 hrs:  BP Temp Pulse Resp SpO2 Weight  11/11/19 1303 (!) 172/83 - - - - -  11/11/19 1300 (!) 172/83 - - - - -  11/11/19 1245 (!) 158/98 - (!) 101 - - -  11/11/19 1232 (!) 162/95 - - - - -  11/11/19 1230 (!) 162/95 - - - - -  11/11/19 1215 (!) 172/91 - 91 - 99 % -  11/11/19 1200 (!) 161/92 - 89 - - -  11/11/19 1145 (!) 156/102 - 89 - - -  11/11/19 1130 (!) 155/90 - - - - -  11/11/19 1127 (!) 160/90 (!) 97.3 F (36.3 C) 89 16 100 % -  11/11/19 1123 - - - - - 65.6 kg   Physical Exam  Constitutional: She is oriented to person, place, and time. She appears well-developed and well-nourished. No distress.  HENT:  Head: Normocephalic and atraumatic.  Respiratory: Effort normal.  GI: Soft. She exhibits no distension and no mass. There is no abdominal tenderness. There is no rebound and no guarding.  C/S incision appears well-healed and well-approximated with no s/sx of infection.  Neurological: She is alert and oriented to person, place, and time.  Skin: Skin is warm and dry. She is not diaphoretic.   Psychiatric: She has a normal mood and affect. Her behavior is normal. Judgment and thought content normal.   Results for orders placed or performed during the hospital encounter of 11/11/19 (from the past 24 hour(s))  Urinalysis, Routine w reflex microscopic     Status: Abnormal   Collection Time: 11/11/19 11:34 AM  Result Value Ref Range   Color, Urine YELLOW YELLOW   APPearance HAZY (A) CLEAR  Specific Gravity, Urine 1.011 1.005 - 1.030   pH 7.0 5.0 - 8.0   Glucose, UA NEGATIVE NEGATIVE mg/dL   Hgb urine dipstick MODERATE (A) NEGATIVE   Bilirubin Urine NEGATIVE NEGATIVE   Ketones, ur NEGATIVE NEGATIVE mg/dL   Protein, ur NEGATIVE NEGATIVE mg/dL   Nitrite NEGATIVE NEGATIVE   Leukocytes,Ua LARGE (A) NEGATIVE   RBC / HPF 21-50 0 - 5 RBC/hpf   WBC, UA >50 (H) 0 - 5 WBC/hpf   Bacteria, UA RARE (A) NONE SEEN   Squamous Epithelial / LPF 0-5 0 - 5   Mucus PRESENT   CBC     Status: Abnormal   Collection Time: 11/11/19 12:03 PM  Result Value Ref Range   WBC 14.2 (H) 4.0 - 10.5 K/uL   RBC 3.95 3.87 - 5.11 MIL/uL   Hemoglobin 12.0 12.0 - 15.0 g/dL   HCT 35.9 (L) 36.0 - 46.0 %   MCV 90.9 80.0 - 100.0 fL   MCH 30.4 26.0 - 34.0 pg   MCHC 33.4 30.0 - 36.0 g/dL   RDW 13.1 11.5 - 15.5 %   Platelets 372 150 - 400 K/uL   nRBC 0.0 0.0 - 0.2 %  Comprehensive metabolic panel     Status: Abnormal   Collection Time: 11/11/19 12:03 PM  Result Value Ref Range   Sodium 138 135 - 145 mmol/L   Potassium 3.7 3.5 - 5.1 mmol/L   Chloride 106 98 - 111 mmol/L   CO2 23 22 - 32 mmol/L   Glucose, Bld 77 70 - 99 mg/dL   BUN 10 6 - 20 mg/dL   Creatinine, Ser 0.58 0.44 - 1.00 mg/dL   Calcium 9.3 8.9 - 10.3 mg/dL   Total Protein 6.0 (L) 6.5 - 8.1 g/dL   Albumin 2.5 (L) 3.5 - 5.0 g/dL   AST 19 15 - 41 U/L   ALT 21 0 - 44 U/L   Alkaline Phosphatase 130 (H) 38 - 126 U/L   Total Bilirubin 0.6 0.3 - 1.2 mg/dL   GFR calc non Af Amer >60 >60 mL/min   GFR calc Af Amer >60 >60 mL/min   Anion gap 9 5 - 15    MAU Course  Procedures  MDM -preeclampsia evaluation without symptoms, but with severe range pressures in MAU -protocol initiated with Procardia as patient did not have IV started at time of second severe range pressure -UA: hazy/mod hgb/lg leuks/rare bacteria, sending urine for culture -CBC: WBCs 14.2, platelets 372 -CMP: no abnormalities requiring treatment, AST/ALT 19/21 -per consultation with Dr. Nehemiah Settle, recommend admission for severe postpartum preeclampsia. -called and spoke with Dr. Murrell Redden '@100PM'$  to recommend admission. Dr. Murrell Redden agrees with plan for admission and will enter orders. -admit to [facility]  Orders Placed This Encounter  Procedures  . Culture, OB Urine    Standing Status:   Standing    Number of Occurrences:   1  . Urinalysis, Routine w reflex microscopic    Standing Status:   Standing    Number of Occurrences:   1  . CBC    Standing Status:   Standing    Number of Occurrences:   1  . Comprehensive metabolic panel    Standing Status:   Standing    Number of Occurrences:   1  . Notify Physician    Confirmatory reading of BP> 160/110 15 minutes later    Standing Status:   Standing    Number of Occurrences:   1  Order Specific Question:   Notify Physician    Answer:   Temp greater than or equal to 100.4    Order Specific Question:   Notify Physician    Answer:   RR greater than 24 or less than 10    Order Specific Question:   Notify Physician    Answer:   HR greater than 120 or less than 50    Order Specific Question:   Notify Physician    Answer:   SBP greater than 160 mmHG or less than 80 mmHG    Order Specific Question:   Notify Physician    Answer:   DBP greater than 110 mmHG or less than 45 mmHG    Order Specific Question:   Notify Physician    Answer:   Urinary output is less than 140m for any 4 hour period  . Measure blood pressure    10 minutes after giving labetalol 40 MG IV dose.  Call MD if SBP >/= 160 or DBP >/= 110.     Standing Status:   Standing    Number of Occurrences:   1   Meds ordered this encounter  Medications  . AND Linked Order Group   . NIFEdipine (PROCARDIA) capsule 10 mg   . NIFEdipine (PROCARDIA) capsule 20 mg   . NIFEdipine (PROCARDIA) capsule 20 mg   . labetalol (NORMODYNE) injection 40 mg   Assessment and Plan   1. Severe pre-eclampsia, postpartum condition or complication   2. White coat syndrome without diagnosis of hypertension    -[facility] 11/11/2019, 1:16 PM

## 2019-11-11 NOTE — MAU Note (Signed)
.   Bethany Campos is a 33 y.o. at Unknown here in MAU reporting: that she was evaluated in the office today for follow up for increase in blood pressures and was sent here due to an increase. Denies any HA or blurred vision LMP: del C/S on 11/04/2019 Onset of complaint: ongoing Pain score: 0 Vitals:   11/11/19 1127  BP: (!) 160/90  Pulse: 89  Resp: 16  Temp: (!) 97.3 F (36.3 C)  SpO2: 100%     FHT: Lab orders placed from triage: UA

## 2019-11-12 LAB — CULTURE, OB URINE: Culture: <10000 — AB

## 2019-11-12 MED ORDER — NIFEDIPINE ER OSMOTIC RELEASE 30 MG PO TB24
30.0000 mg | ORAL_TABLET | Freq: Every day | ORAL | Status: DC
  Administered 2019-11-12 – 2019-11-13 (×2): 30 mg via ORAL
  Filled 2019-11-12 (×2): qty 1

## 2019-11-12 MED ORDER — MAGNESIUM SULFATE 40 GM/1000ML IV SOLN: 2.0000 g/h | INTRAVENOUS | Status: AC

## 2019-11-12 MED ORDER — ACETAMINOPHEN 325 MG PO TABS
650.0000 mg | ORAL_TABLET | Freq: Four times a day (QID) | ORAL | Status: DC | PRN
  Administered 2019-11-12: 650 mg via ORAL
  Filled 2019-11-12: qty 2

## 2019-11-12 NOTE — Progress Notes (Signed)
Had mild h/a this am, resolved after tylenol; no other c/o; didn't sleep much so a little tired and wants to sleep No sob/cp, no n/v, no abd pain or vision changes   Patient Vitals for the past 24 hrs:  BP Temp Temp src Pulse Resp SpO2 Height Weight  11/12/19 0755 131/78 98.3 F (36.8 C) Oral 91 18 100 % - -  11/12/19 0500 - - - - 16 - - -  11/12/19 0410 - - - - 18 - - -  11/12/19 0306 125/78 97.9 F (36.6 C) Oral 99 17 100 % - -  11/12/19 0200 - - - - 18 - - -  11/12/19 0100 - - - - 18 - - -  11/11/19 2350 - - - - 18 - - -  11/11/19 2247 124/76 - - 99 16 100 % - -  11/11/19 2235 - - - - 16 - - -  11/11/19 2133 128/74 - - 100 18 99 % - -  11/11/19 2058 - - - - 16 - - -  11/11/19 2029 124/70 - - (!) 106 18 100 % - -  11/11/19 1936 132/78 97.6 F (36.4 C) Oral (!) 111 18 100 % - -  11/11/19 1905 - - - - 18 - - -  11/11/19 1852 131/71 - - (!) 114 - - - -  11/11/19 1743 136/74 - - (!) 108 18 100 % - -  11/11/19 1648 132/79 97.6 F (36.4 C) Oral (!) 103 17 100 % - -  11/11/19 1459 130/70 - - (!) 110 - - - -  11/11/19 1408 138/79 - - (!) 126 16 99 % - -  11/11/19 1359 136/81 - - (!) 122 16 99 % - -  11/11/19 1351 139/82 - - (!) 111 16 - - -  11/11/19 1338 (!) 147/88 97.9 F (36.6 C) Oral (!) 113 16 100 % 5\' 7"  (1.702 m) 64.9 kg  11/11/19 1330 (!) 153/88 - - (!) 132 - - - -  11/11/19 1315 (!) 155/92 - - (!) 108 - 99 % - -  11/11/19 1303 (!) 172/83 - - - - - - -  11/11/19 1300 (!) 172/83 - - - - - - -  11/11/19 1245 (!) 158/98 - - (!) 101 - - - -  11/11/19 1232 (!) 162/95 - - - - - - -  11/11/19 1230 (!) 162/95 - - - - - - -  11/11/19 1215 (!) 172/91 - - 91 - 99 % - -  11/11/19 1200 (!) 161/92 - - 89 - - - -  11/11/19 1145 (!) 156/102 - - 89 - - - -  11/11/19 1130 (!) 155/90 - - - - - - -  11/11/19 1127 (!) 160/90 (!) 97.3 F (36.3 C) - 89 16 100 % - -  11/11/19 1123 - - - - - - - 65.6 kg    Intake/Output Summary (Last 24 hours) at 11/12/2019 0954 Last data filed at  11/12/2019 0902 Gross per 24 hour  Intake 4260.83 ml  Output 6700 ml  Net -2439.17 ml    A&ox3, pumping rrr ctab Abd: soft, nt, nd; incision, healing well LE : no edema, nt bilat; dtr 2+  A/P: pp pre-e with severe features 1.  No severe range bps since 1p yesterday, mostly wnl; good UOP, plan to stop magnesium sulfate at 2pm; contin procardia 30 q day; d/c home tomorrow possible if bps stable after magnesium stopped 2.  Pumping 3. Anxiety - much improved since admission; feeling better 4. PO c/s 11/17, doing well

## 2019-11-13 MED ORDER — NIFEDIPINE ER 30 MG PO TB24: 30.0000 mg | ORAL_TABLET | Freq: Every day | ORAL | 2 refills | Status: DC

## 2019-11-13 NOTE — Progress Notes (Signed)
PP PEC HD 2 Feeling better. No headaches.  No sob/cp, no n/v, no abd pain or vision changes   Patient Vitals for the past 24 hrs:  BP Temp Temp src Pulse Resp SpO2  11/13/19 0753 134/83 98.8 F (37.1 C) Oral 88 18 100 %  11/13/19 0520 134/85 98.4 F (36.9 C) Oral 92 18 100 %  11/12/19 2326 133/80 98.6 F (37 C) Oral 96 18 100 %  11/12/19 2055 138/75 98.2 F (36.8 C) Oral 98 18 96 %  11/12/19 1510 129/89 98.6 F (37 C) Oral 94 18 99 %  11/12/19 1127 124/84 98.4 F (36.9 C) Oral (!) 105 18 100 %    Intake/Output Summary (Last 24 hours) at 11/13/2019 0919 Last data filed at 11/13/2019 0700 Gross per 24 hour  Intake 4924.26 ml  Output 4650 ml  Net 274.26 ml    NCAT HEENT: nl Neck: supple CV: RRR Lungs: CTA ABD: NT, No RUQ tenderness No CVAT EXT: Neg c/c/e Minimal vaginal bleeding  A/P:  PP PEC stable on Procardia. Exc Diuresis. Nl labs PP anxiety - stable  DC home Procardia XL Fu office one week

## 2019-11-13 NOTE — Lactation Note (Signed)
Lactation Consultation Note  Patient Name: Bethany Campos Date: 11/13/2019 Reason for consult: Follow-up assessment;Other (Comment)(readmitted on Mag[place]/ d/c and D/C today . milk in - see [place] note)  [place] checked mom , dad holding baby. Mom had recently pumped with the DEBP with 3 4- oz from each breast. Milk is well established. Mom denies sore nipples or engorgement prevention of tx.  LC instructed mom on the use of the hand pump to keep it simple and she has the DEBP kit and her pump at home.  Mom aware of the [facility] resources if needed.    Maternal Data Has patient been taught Hand Expression?: Yes  Feeding    LATCH Score                   Interventions Interventions: Breast feeding basics reviewed  Lactation Tools Discussed/Used Tools: Pump Breast pump type: Double-Electric Breast Pump;Manual Pump Review: Setup, frequency, and cleaning;Milk Storage Initiated by:: MAI for hand pump Date initiated:: 11/13/19   Consult Status Consult Status: Complete    Jerlyn Ly Ermina Oberman 11/13/2019, 10:17 AM

## 2019-11-13 NOTE — Progress Notes (Signed)
Pt ambulated out teaching complete  

## 2019-11-16 NOTE — Discharge Summary (Signed)
Bethany Campos, Bethany Campos MEDICAL RECORD [id number] ACCOUNT [id number] DATE OF BIRTH:10/19/1986 FACILITY: [facility] LOCATION: [facility] PHYSICIAN:Sholanda Croson J. Ronita Hipps, MD  DISCHARGE SUMMARY  DATE OF DISCHARGE:  11/13/2019  CHIEF COMPLAINT:  Postpartum pre-eclampsia.  DISCHARGE DIAGNOSIS:  Postpartum pre-eclampsia.    HOSPITAL COURSE:  The patient presented postpartum with elevated blood pressures and normal labs.  She was placed on magnesium sulfate prophylaxis for 24 hours and Procardia-XL for blood pressure control with good resolution.  She tolerated diet well.   No symptoms were noted.  Blood pressure normalized.  She was discharged home on hospital day #2.  Discharge teaching done.  DISCHARGE MEDICATIONS:  Prenatal vitamins and Procardia-XL.    She is to follow up in the office in 1 week.  Pre-eclampsia precautions were given.  VN/NUANCE D:11/16/2019 T:11/16/2019 JOB:[id number]

## 2020-06-16 DIAGNOSIS — Z6821 Body mass index (BMI) 21.0-21.9, adult: Secondary | ICD-10-CM | POA: Diagnosis not present

## 2020-06-16 DIAGNOSIS — Z01419 Encounter for gynecological examination (general) (routine) without abnormal findings: Secondary | ICD-10-CM | POA: Diagnosis not present

## 2020-07-06 DIAGNOSIS — Z1329 Encounter for screening for other suspected endocrine disorder: Secondary | ICD-10-CM | POA: Diagnosis not present

## 2020-07-06 DIAGNOSIS — Z0000 Encounter for general adult medical examination without abnormal findings: Secondary | ICD-10-CM | POA: Diagnosis not present

## 2020-07-06 DIAGNOSIS — Z130 Encounter for screening for diseases of the blood and blood-forming organs and certain disorders involving the immune mechanism: Secondary | ICD-10-CM | POA: Diagnosis not present

## 2020-07-06 DIAGNOSIS — Z13220 Encounter for screening for lipoid disorders: Secondary | ICD-10-CM | POA: Diagnosis not present

## 2020-09-28 DIAGNOSIS — R059 Cough, unspecified: Secondary | ICD-10-CM | POA: Diagnosis not present

## 2020-11-23 DIAGNOSIS — D485 Neoplasm of uncertain behavior of skin: Secondary | ICD-10-CM | POA: Diagnosis not present

## 2020-11-23 DIAGNOSIS — L814 Other melanin hyperpigmentation: Secondary | ICD-10-CM | POA: Diagnosis not present

## 2020-11-23 DIAGNOSIS — L821 Other seborrheic keratosis: Secondary | ICD-10-CM | POA: Diagnosis not present

## 2021-05-27 ENCOUNTER — Encounter: Payer: Self-pay | Admitting: Obstetrics and Gynecology

## 2021-05-27 ENCOUNTER — Other Ambulatory Visit: Payer: Self-pay

## 2021-05-27 ENCOUNTER — Telehealth: Payer: Self-pay | Admitting: *Deleted

## 2021-05-27 ENCOUNTER — Ambulatory Visit (INDEPENDENT_AMBULATORY_CARE_PROVIDER_SITE_OTHER): Payer: BC Managed Care – PPO | Admitting: Obstetrics and Gynecology

## 2021-05-27 VITALS — BP 124/68 | HR 86 | Ht 67.0 in | Wt 128.0 lb

## 2021-05-27 DIAGNOSIS — N6325 Unspecified lump in the left breast, overlapping quadrants: Secondary | ICD-10-CM | POA: Diagnosis not present

## 2021-05-27 NOTE — Telephone Encounter (Signed)
-----   Message from Salvadore Dom, MD sent at 05/27/2021  8:39 AM EDT ----- Please schedule this patient for diagnostic breast imaging at the breast center. Lump in the left breast between 8-9 o'clock. Thanks, Sharee Pimple

## 2021-05-27 NOTE — Telephone Encounter (Signed)
Patient scheduled at breast center on 07/08/21 @ 10:45am, can call daily/weekly to check cancellations. Detailed message left on cell per DPR access with all the above as well as breast center # to call.

## 2021-05-27 NOTE — Progress Notes (Signed)
GYNECOLOGY  VISIT   HPI: 35 y.o.   Married White or Caucasian Not Hispanic or Latino  female   [phone] with Patient's last menstrual period was 05/26/2021.   here for left breast lump.   She hasn't nursed for over a year. She noticed this lump about a year ago. Not tender, hasn't changed in size.   GYNECOLOGIC HISTORY: Patient's last menstrual period was 05/26/2021. Contraception:none  Menopausal hormone therapy: none         OB History     Gravida  2   Para  1   Term  1   Preterm  0   AB  1   Living  1      SAB  1   IAB  0   Ectopic  0   Multiple  0   Live Births  1              Patient Active Problem List   Diagnosis Date Noted   Hypertension in pregnancy, preeclampsia, severe, postpartum condition 11/11/2019   Gestational hypertension 11/05/2019   Rh negative, maternal 11/05/2019   Indication for care in labor or delivery 11/04/2019   Postpartum care following cesarean delivery (11/17) 11/04/2019    Past Medical History:  Diagnosis Date   Dysmenorrhea    Elevated cholesterol    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     calcium carbonate (TUMS - DOSED IN MG ELEMENTAL CALCIUM) 500 MG chewable tablet Chew 2 tablets by mouth as needed for indigestion or heartburn.     ibuprofen (ADVIL) 800 MG tablet Take 1 tablet (800 mg total) by mouth every 6 (six) hours. 30 tablet 0   Magnesium 250 MG TABS Take 250 mg by  mouth daily.     Prenatal Vit-Fe Fumarate-FA (MULTIVITAMIN-PRENATAL) 27-0.8 MG TABS tablet Take 1 tablet by mouth daily at 12 noon.     No current facility-administered medications for this visit.     ALLERGIES: Patient has no known allergies.  Family History  Problem Relation Age of Onset   Parkinson's disease Mother    Diabetes Father    Breast cancer Paternal Aunt 42       bilateral mastectomy - BRCA test unknown   Heart failure Paternal Grandfather     Social History   Socioeconomic History   Marital status: Married    Spouse name: Not on file   Number of children: Not on file   Years of education: Not on file   Highest education level: Not on file  Occupational History   Not on file  Tobacco Use   Smoking status: Never   Smokeless tobacco: Never  Vaping Use   Vaping Use: Never used  Substance and Sexual Activity   Alcohol use: No   Drug use: No   Sexual activity: Yes    Partners: Male  Birth control/protection: Coitus interruptus  Other Topics Concern   Not on file  Social History Narrative   Not on file   Social Determinants of Health   Financial Resource Strain: Not on file  Food Insecurity: Not on file  Transportation Needs: Not on file  Physical Activity: Not on file  Stress: Not on file  Social Connections: Not on file  Intimate Partner Violence: Not on file    Review of Systems  All other systems reviewed and are negative.  PHYSICAL EXAMINATION:    BP 124/68   Pulse 86   Ht _0  (1.702 m)   Wt 128 lb (58.1 kg)   LMP 05/26/2021   SpO2 100%   BMI 20.05 kg/m     General appearance: alert, cooperative and appears stated age Breasts:  in the left breast between 8-9 o'clock a few cm from the areolar region is a smooth, mobile, oval lump ~6-7 mm. Not tender. No other lumps.  Patient examined supine and sitting.  No axillary or supraclavicular adenopathy.   1. Breast lump on left side at 9 o'clock position Set up diagnostic breast  imaging Annual exam in 8/22.

## 2021-07-08 ENCOUNTER — Ambulatory Visit
Admission: RE | Admit: 2021-07-08 | Discharge: 2021-07-08 | Disposition: A | Discharge status: Home / Self Care | Payer: BC Managed Care – PPO | Source: Ambulatory Visit | Attending: Obstetrics and Gynecology | Admitting: Obstetrics and Gynecology

## 2021-07-08 ENCOUNTER — Other Ambulatory Visit: Payer: Self-pay

## 2021-07-08 DIAGNOSIS — R922 Inconclusive mammogram: Secondary | ICD-10-CM | POA: Diagnosis not present

## 2021-07-08 DIAGNOSIS — N6325 Unspecified lump in the left breast, overlapping quadrants: Secondary | ICD-10-CM

## 2021-07-18 NOTE — Progress Notes (Signed)
35 y.o. G77P1011 Married White or Caucasian Not Hispanic or Latino female here for annual exam.   She was seen in 6/22 c/o a breast lump in the left breast between 8-9 o'clock, imaging was c/w a simple cyst.  Period Cycle (Days): 28 Period Duration (Days): 4 Period Pattern: Regular Menstrual Flow: Moderate, Light Menstrual Control: Tampon, Maxi pad Menstrual Control Change Freq (Hours): 2-3 Dysmenorrhea: (!) Mild Dysmenorrhea Symptoms: Cramping Using w/d for contraception, okay if she gets pregnant. Isn't planning on trying to get pregnant. No dyspareunia.   Patient's last menstrual period was 07/14/2021.          Sexually active: Yes.    The current method of family planning is none. Withdraw Exercising: Yes.     Walking  Smoker:  no  Health Maintenance: Pap: 03/13/18 WNL Hr HPV Neg , 01/25/15 WNL Hr HPV Neg   History of abnormal Pap:  no MMG:  DIAG Density Bi-rads 2 benign  BMD:   n/a Colonoscopy: n/a TDaP:  03/12/17  Gardasil: Complete    reports that she has never smoked. She has never used smokeless tobacco. She reports that she does not drink alcohol and does not use drugs. Home with her daughter Leonides Sake, 20 months.   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     Magnesium 250 MG TABS Take 250 mg by mouth daily.     No  current facility-administered medications for this visit.    Family History  Problem Relation Age of Onset   Parkinson's disease Mother    Diabetes Father    Breast cancer Paternal Aunt 78       bilateral mastectomy - BRCA test unknown   Heart failure Paternal Grandfather   Paternal Elenor Legato is her Dad's half sister. No other FH of breast cancer  Review of Systems  All other systems reviewed and are negative.  Exam:   BP 124/70   Pulse 94   Ht $R'5\' 7"'rJ$  (1.702 m)   Wt 126 lb (57.2 kg)   LMP 07/14/2021   SpO2 100%   BMI 19.73 kg/m   Weight change: $RemoveBefore'@WEIGHTCHANGE'NphtNWXfdwREL$ @ Height:   Height: $Remove'5\' 7"'pRtjpeu$  (170.2 cm)  Ht Readings from Last 3 Encounters:  07/19/21 $RemoveB'5\' 7"'mVxaBbbP$  (1.702 m)  05/27/21 $RemoveB'5\' 7"'luFZzFus$  (1.702 m)  11/11/19 $RemoveB'5\' 7"'vgaqdYQe$  (1.702 m)    General appearance: alert, cooperative and appears stated age Head: Normocephalic, without obvious abnormality, atraumatic Neck: no adenopathy, supple, symmetrical, trachea midline and thyroid normal to inspection and palpation Lungs: clear to auscultation bilaterally Cardiovascular: regular rate and rhythm Breasts:  in the left breast between 8-9 o'clock a few cm from the areolar region is a 5-6 mm, smooth, mobile lump (known cyst). No other lumps, no skin changes.  Abdomen: soft, non-tender; non distended,  no masses,  no organomegaly Extremities: extremities normal, atraumatic, no cyanosis or edema Skin: Skin color, texture, turgor normal. No rashes or lesions Lymph nodes: Cervical, supraclavicular, and axillary nodes normal. No abnormal inguinal nodes palpated Neurologic: Grossly normal   Pelvic: External genitalia:  no lesions              Urethra:  normal appearing urethra with no masses, tenderness or lesions              Bartholins and Skenes: normal                 Vagina: normal appearing vagina with normal color and discharge, no lesions              Cervix: no lesions               Bimanual Exam:  Uterus:  normal size, contour, position, consistency,  mobility, non-tender              Adnexa: no mass, fullness, tenderness               Rectovaginal: Confirms               Anus:  normal sphincter tone, no lesions  Gae Dry chaperoned for the exam.  1. Well woman exam No pap this year Discussed breast self exam Discussed calcium and vit D intake   2. Laboratory exam ordered as part of routine general medical examination - CBC - Comprehensive metabolic panel - Lipid panel

## 2021-07-19 ENCOUNTER — Other Ambulatory Visit: Payer: Self-pay

## 2021-07-19 ENCOUNTER — Encounter: Payer: Self-pay | Admitting: Obstetrics and Gynecology

## 2021-07-19 ENCOUNTER — Ambulatory Visit (INDEPENDENT_AMBULATORY_CARE_PROVIDER_SITE_OTHER): Payer: BC Managed Care – PPO | Admitting: Obstetrics and Gynecology

## 2021-07-19 VITALS — BP 124/70 | HR 94 | Ht 67.0 in | Wt 126.0 lb

## 2021-07-19 DIAGNOSIS — Z0000 Encounter for general adult medical examination without abnormal findings: Secondary | ICD-10-CM

## 2021-07-19 DIAGNOSIS — Z01419 Encounter for gynecological examination (general) (routine) without abnormal findings: Secondary | ICD-10-CM | POA: Diagnosis not present

## 2021-07-19 DIAGNOSIS — E0590 Thyrotoxicosis, unspecified without thyrotoxic crisis or storm: Secondary | ICD-10-CM | POA: Insufficient documentation

## 2021-07-19 NOTE — Patient Instructions (Signed)

## 2021-07-20 LAB — COMPREHENSIVE METABOLIC PANEL
AG Ratio: 2 (calc) (ref 1.0–2.5)
ALT: 14 U/L (ref 6–29)
AST: 19 U/L (ref 10–30)
Albumin: 4.7 g/dL (ref 3.6–5.1)
Alkaline phosphatase (APISO): 60 U/L (ref 31–125)
BUN: 13 mg/dL (ref 7–25)
CO2: 28 mmol/L (ref 20–32)
Calcium: 9.7 mg/dL (ref 8.6–10.2)
Chloride: 103 mmol/L (ref 98–110)
Creat: 0.77 mg/dL (ref 0.50–0.97)
Globulin: 2.3 g/dL (calc) (ref 1.9–3.7)
Glucose, Bld: 84 mg/dL (ref 65–99)
Potassium: 4.3 mmol/L (ref 3.5–5.3)
Sodium: 139 mmol/L (ref 135–146)
Total Bilirubin: 0.3 mg/dL (ref 0.2–1.2)
Total Protein: 7 g/dL (ref 6.1–8.1)

## 2021-07-20 LAB — LIPID PANEL
Cholesterol: 217 mg/dL — ABNORMAL HIGH (ref <200)
HDL: 80 mg/dL (ref >=50)
LDL Cholesterol (Calc): 117 mg/dL (calc) — ABNORMAL HIGH
Non-HDL Cholesterol (Calc): 137 mg/dL (calc) — ABNORMAL HIGH (ref <130)
Total CHOL/HDL Ratio: 2.7 (calc) (ref <5.0)
Triglycerides: 96 mg/dL (ref <150)

## 2021-07-20 LAB — CBC
HCT: 43.6 % (ref 35.0–45.0)
Hemoglobin: 14.7 g/dL (ref 11.7–15.5)
MCH: 29.8 pg (ref 27.0–33.0)
MCHC: 33.7 g/dL (ref 32.0–36.0)
MCV: 88.3 fL (ref 80.0–100.0)
MPV: 10.3 fL (ref 7.5–12.5)
Platelets: 286 10*3/uL (ref 140–400)
RBC: 4.94 10*6/uL (ref 3.80–5.10)
RDW: 12.5 % (ref 11.0–15.0)
WBC: 8.2 10*3/uL (ref 3.8–10.8)

## 2021-08-15 DIAGNOSIS — L301 Dyshidrosis [pompholyx]: Secondary | ICD-10-CM | POA: Diagnosis not present

## 2021-10-12 ENCOUNTER — Ambulatory Visit: Payer: BC Managed Care – PPO | Admitting: Obstetrics and Gynecology

## 2022-06-26 DIAGNOSIS — L578 Other skin changes due to chronic exposure to nonionizing radiation: Secondary | ICD-10-CM | POA: Diagnosis not present

## 2022-06-26 DIAGNOSIS — L814 Other melanin hyperpigmentation: Secondary | ICD-10-CM | POA: Diagnosis not present

## 2022-06-26 DIAGNOSIS — L821 Other seborrheic keratosis: Secondary | ICD-10-CM | POA: Diagnosis not present

## 2022-06-26 DIAGNOSIS — D225 Melanocytic nevi of trunk: Secondary | ICD-10-CM | POA: Diagnosis not present

## 2022-07-18 NOTE — Progress Notes (Signed)
36 y.o. G58P1011 Married White or Caucasian Not Hispanic or Latino female here for annual exam.   Period Cycle (Days): 28 Period Duration (Days): 4 Period Pattern: Regular Menstrual Flow: Heavy Menstrual Control: Maxi pad, Tampon Menstrual Control Change Freq (Hours): 2 Dysmenorrhea: (!) Mild Dysmenorrhea Symptoms: Cramping Using W/D and rhythm for contraception, no plans to get pregnant. Okay if she does get pregnant, on a multivitamin.   Patient's last menstrual period was 07/11/2022 (approximate).          Sexually active: Yes.    The current method of family planning is coitus interruptus.    Exercising: Yes.     Walking  Smoker:  no  Health Maintenance: Pap:  03/13/18 WNL Hr HPV Neg  History of abnormal Pap:  no MMG:  07/08/21 density D Bi-rads 2 benign followed up with left axilla [place]  Bi-rads 2 benign  BMD:   N/A Colonoscopy: n/a TDaP:  03/12/17 Gardasil: complete   reports that she has never smoked. She has never used smokeless tobacco. She reports that she does not drink alcohol and does not use drugs. Just occasional ETOH. Home with her 52.53 year old daughter Leonides Sake.   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     Magnesium 250 MG TABS Take 250 mg by mouth daily.     Multiple Vitamin (MULTIVITAMIN) capsule Take 1  capsule by mouth daily.     No current facility-administered medications for this visit.    Family History  Problem Relation Age of Onset   Parkinson's disease Mother    Diabetes Father    Breast cancer Paternal Aunt 106       bilateral mastectomy - BRCA test unknown   Heart failure Paternal Grandfather     Review of Systems  All other systems reviewed and are negative.   Exam:   BP 132/84   Pulse 72   Ht $R'5\' 7"'Ga$  (1.702 m)   Wt 129 lb (58.5 kg)   LMP 07/11/2022 (Approximate)   SpO2 100%   BMI 20.20 kg/m   Weight change: $RemoveBefore'@WEIGHTCHANGE'kwvpTGNnrVQhw$ @ Height:   Height: $Remove'5\' 7"'jpQkKiN$  (170.2 cm)  Ht Readings from Last 3 Encounters:  07/26/22 $RemoveB'5\' 7"'jlfmFcsE$  (1.702 m)  07/19/21 $RemoveB'5\' 7"'GhvhgMUZ$  (1.702 m)  05/27/21 $RemoveB'5\' 7"'ykCatfeL$  (1.702 m)    General appearance: alert, cooperative and appears stated age Head: Normocephalic, without obvious abnormality, atraumatic Neck: no adenopathy, supple, symmetrical, trachea midline and thyroid normal to inspection and palpation Lungs: clear to auscultation bilaterally Cardiovascular: regular rate and rhythm Breasts: normal appearance, no masses or tenderness Abdomen: soft, non-tender; non distended,  no masses,  no organomegaly Extremities: extremities normal, atraumatic, no cyanosis or edema Skin: Skin color, texture, turgor normal. No rashes or lesions Lymph nodes: Cervical,  supraclavicular, and axillary nodes normal. No abnormal inguinal nodes palpated Neurologic: Grossly normal   Pelvic: External genitalia:  no lesions              Urethra:  normal appearing urethra with no masses, tenderness or lesions              Bartholins and Skenes: normal                 Vagina: normal appearing vagina with normal color and discharge, no lesions              Cervix: no lesions               Bimanual Exam:  Uterus:  normal size, contour, position, consistency, mobility, non-tender              Adnexa: no mass, fullness, tenderness               Rectovaginal: Confirms               Anus:   normal sphincter tone, no lesions  Gae Dry chaperoned for the exam.  1. Well woman exam Discussed breast self exam Discussed calcium and vit D intake W/D and rhythm for contraception, on multivit with folic acid  2. Screening for cervical cancer - Cytology - PAP  3. Elevated lipids - Lipid panel

## 2022-07-26 ENCOUNTER — Other Ambulatory Visit (HOSPITAL_COMMUNITY)
Admission: RE | Admit: 2022-07-26 | Discharge: 2022-07-26 | Disposition: A | Discharge status: Home / Self Care | Payer: BC Managed Care – PPO | Source: Ambulatory Visit | Attending: Obstetrics and Gynecology | Admitting: Obstetrics and Gynecology

## 2022-07-26 ENCOUNTER — Encounter: Payer: Self-pay | Admitting: Obstetrics and Gynecology

## 2022-07-26 ENCOUNTER — Ambulatory Visit (INDEPENDENT_AMBULATORY_CARE_PROVIDER_SITE_OTHER): Payer: BC Managed Care – PPO | Admitting: Obstetrics and Gynecology

## 2022-07-26 VITALS — BP 132/84 | HR 72 | Ht 67.0 in | Wt 129.0 lb

## 2022-07-26 DIAGNOSIS — E785 Hyperlipidemia, unspecified: Secondary | ICD-10-CM

## 2022-07-26 DIAGNOSIS — Z01419 Encounter for gynecological examination (general) (routine) without abnormal findings: Secondary | ICD-10-CM | POA: Diagnosis not present

## 2022-07-26 DIAGNOSIS — Z124 Encounter for screening for malignant neoplasm of cervix: Secondary | ICD-10-CM

## 2022-07-26 NOTE — Patient Instructions (Signed)

## 2022-07-27 LAB — LIPID PANEL
Cholesterol: 197 mg/dL (ref <200)
HDL: 77 mg/dL (ref >=50)
LDL Cholesterol (Calc): 106 mg/dL (calc) — ABNORMAL HIGH
Non-HDL Cholesterol (Calc): 120 mg/dL (calc) (ref <130)
Total CHOL/HDL Ratio: 2.6 (calc) (ref <5.0)
Triglycerides: 49 mg/dL (ref <150)

## 2022-07-27 LAB — CYTOLOGY - PAP
Comment: NEGATIVE
Diagnosis: NEGATIVE
High risk HPV: NEGATIVE

## 2022-08-16 IMAGING — US US BREAST*L* LIMITED INC AXILLA
1 series · 8 of 8 positions shown · non-contrast
Comparison: Previous exam(s).

CLINICAL DATA: 35-year-old female with a palpable left breast lump
for 1 year.

EXAM:
DIGITAL DIAGNOSTIC BILATERAL MAMMOGRAM WITH TOMOSYNTHESIS AND CAD;
ULTRASOUND LEFT BREAST LIMITED
TECHNIQUE: Bilateral digital diagnostic mammography and breast tomosynthesis
was performed. The images were evaluated with computer-aided
detection.; Targeted ultrasound examination of the left breast was
performed

[Series 1: us breast*left* limited inc axilla · 0.07mm/px · 8 of 8 slices shown]
[im 1/8]
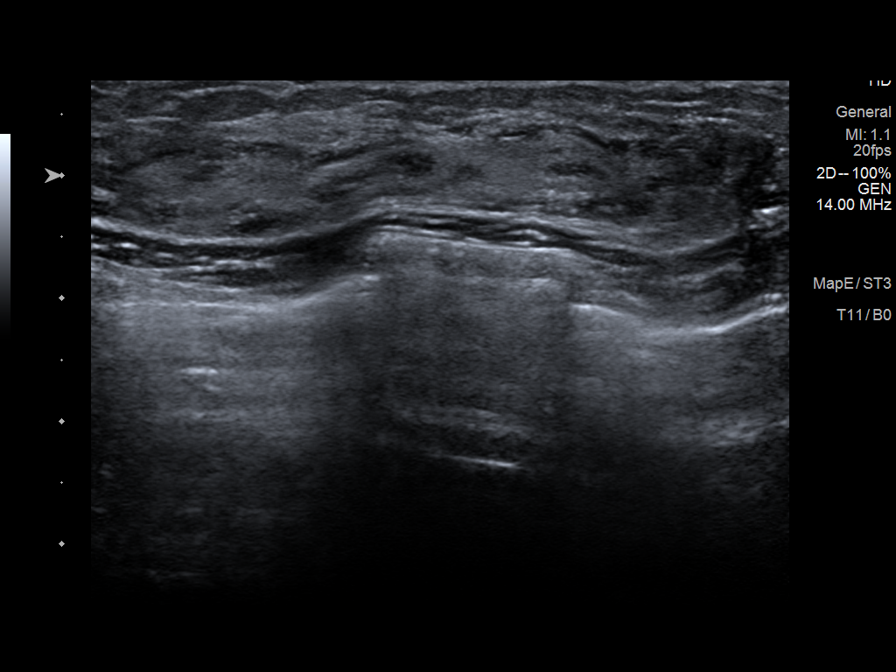
[im 2/8]
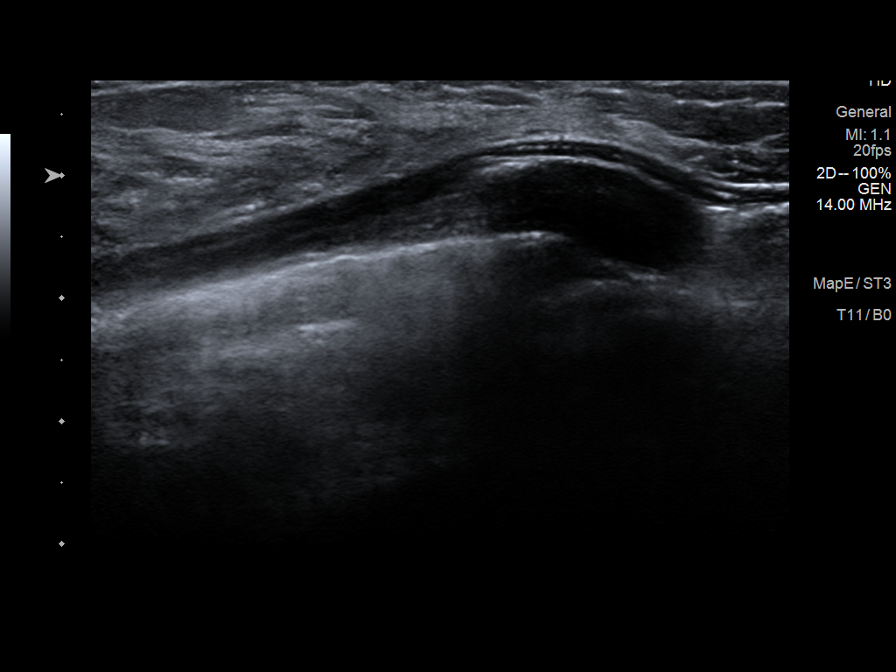
[im 3/8]
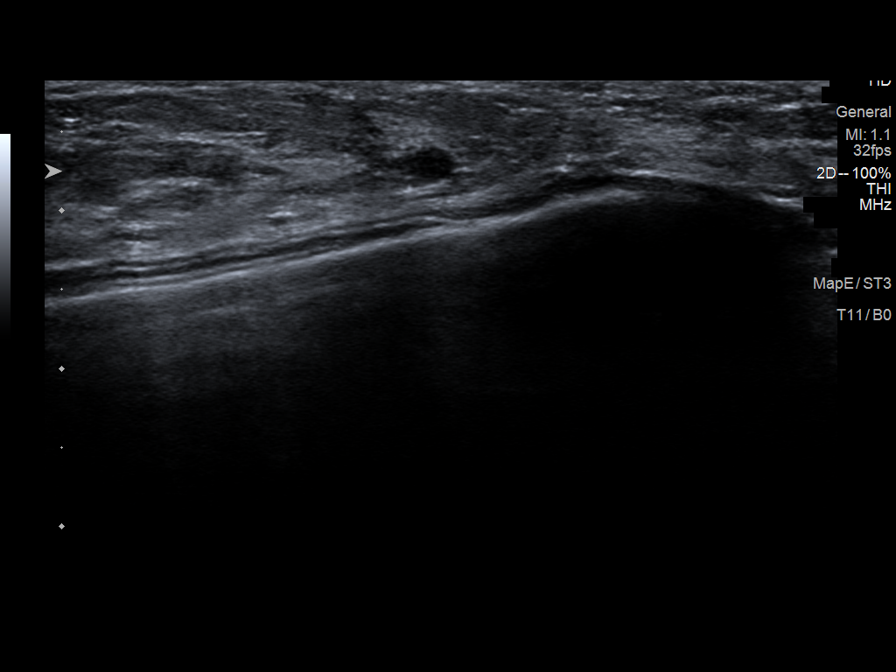
[im 4/8]
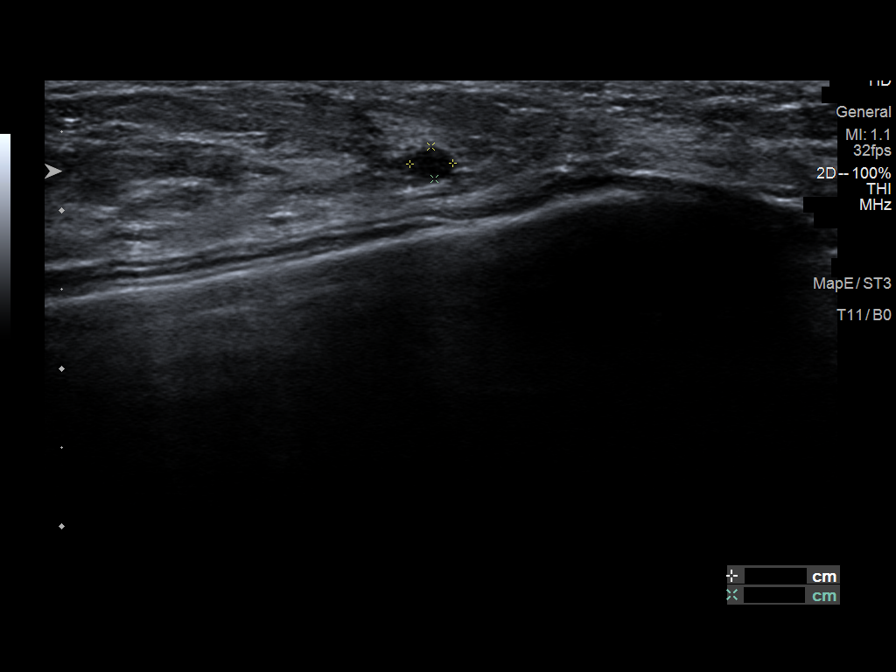
[im 5/8]
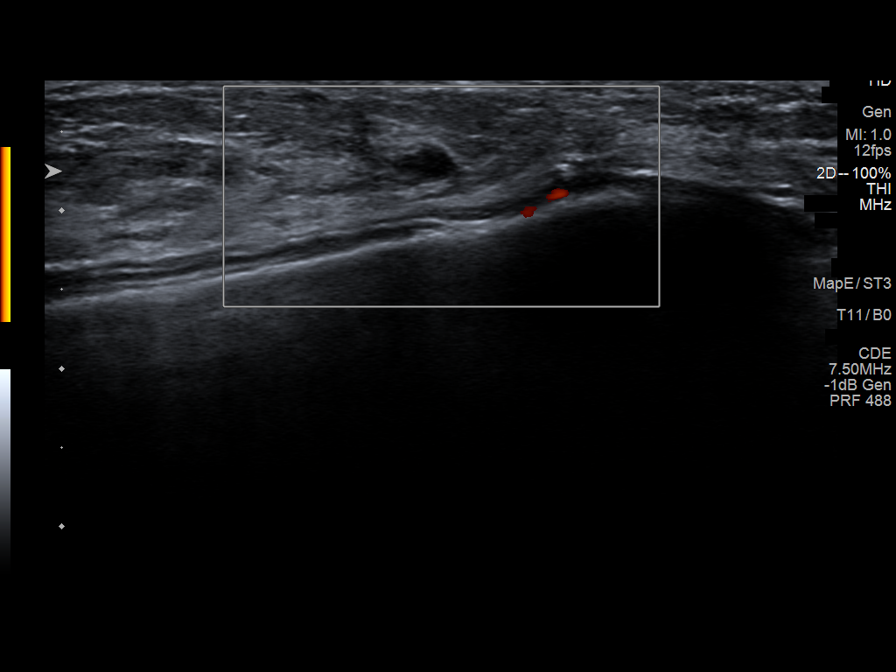
[im 6/8]
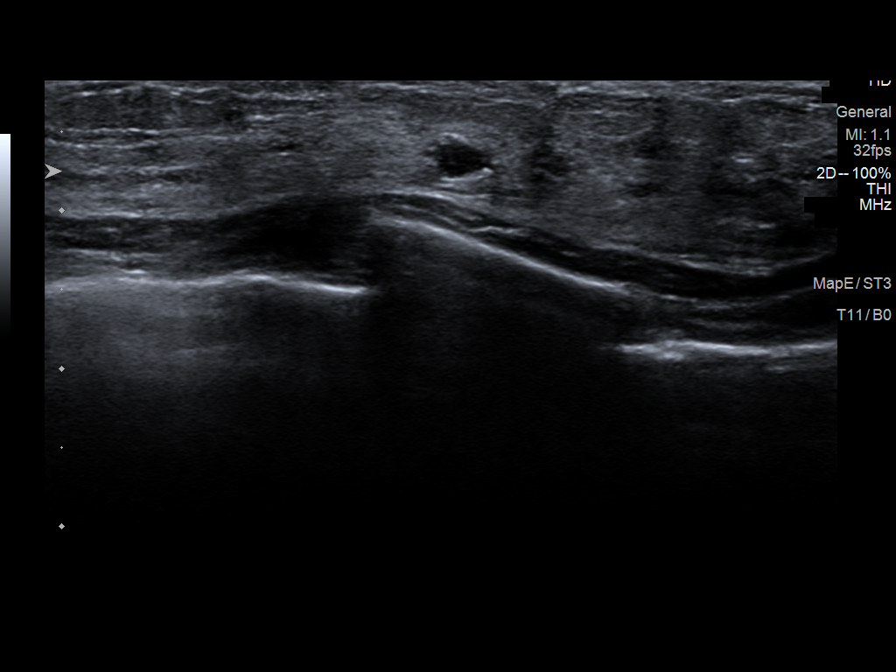
[im 7/8]
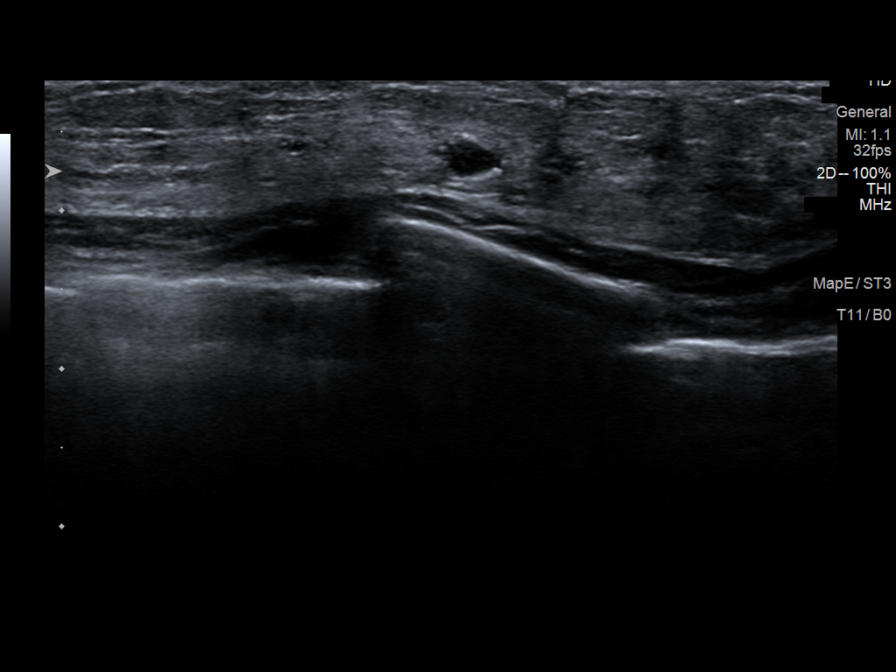
[im 8/8]
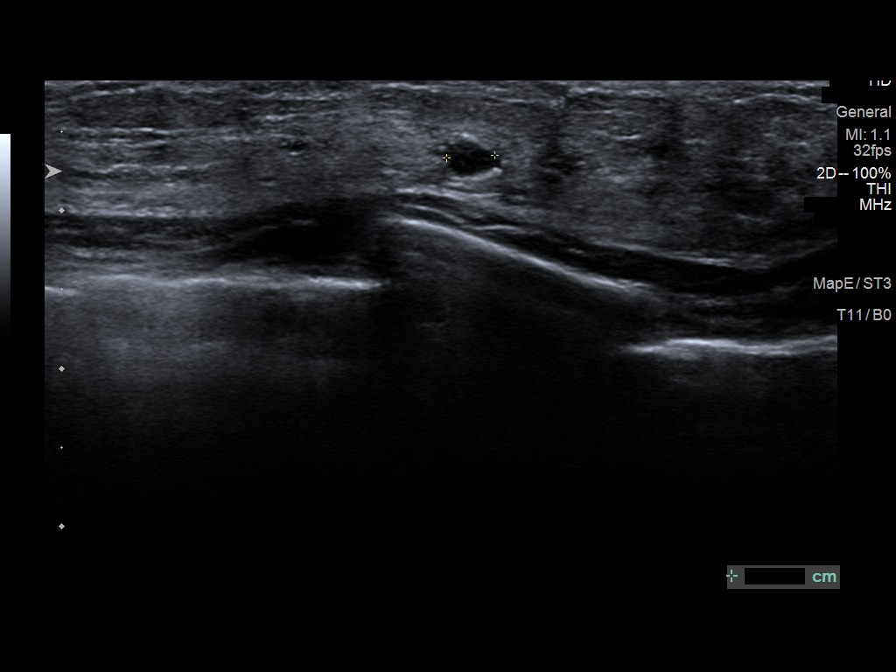

[8 of 8 positions shown; findings below may reference images not displayed]

ACR Breast Density Category d: The breast tissue is extremely dense,
which lowers the sensitivity of mammography.
FINDINGS: No radiopaque BB was placed at the site of the patient's palpable
lump in the lower inner quadrant of the left breast. No focal or
suspicious mammographic findings are seen deep to the radiopaque BB.
No suspicious findings are identified in the remainder of either
breast.

Targeted ultrasound is performed, showing dense fibroglandular
tissue without focal or suspicious sonographic abnormality.
Incidental 3 x 3 x 2 mm simple cyst is identified at the 8 o'clock
position 2 cm from the nipple.
IMPRESSION: 1. No mammographic evidence of malignancy in either breast.
2. No suspicious sonographic findings at the site of the patient's
left breast palpable lump. Note is made of an incidental simple cyst
in the vicinity.

RECOMMENDATION:
1. Clinical follow-up recommended for the palpable area of concern
in the left breast. Any further workup should be based on clinical
grounds.
2. Screening mammogram at age 40 unless there are persistent or
intervening clinical concerns. (Code:Q6-N-LWP)

I have discussed the findings and recommendations with the patient.
If applicable, a reminder letter will be sent to the patient
regarding the next appointment.

BI-RADS CATEGORY  2: Benign.

## 2022-08-16 IMAGING — MG DIGITAL DIAGNOSTIC BILAT W/ TOMO W/ CAD
6 of 10 series · 6 of 30 positions shown · non-contrast
Comparison: Previous exam(s).

CLINICAL DATA: 35-year-old female with a palpable left breast lump
for 1 year.

EXAM:
DIGITAL DIAGNOSTIC BILATERAL MAMMOGRAM WITH TOMOSYNTHESIS AND CAD;
ULTRASOUND LEFT BREAST LIMITED
TECHNIQUE: Bilateral digital diagnostic mammography and breast tomosynthesis
was performed. The images were evaluated with computer-aided
detection.; Targeted ultrasound examination of the left breast was
performed

[R MLO synth-2D]
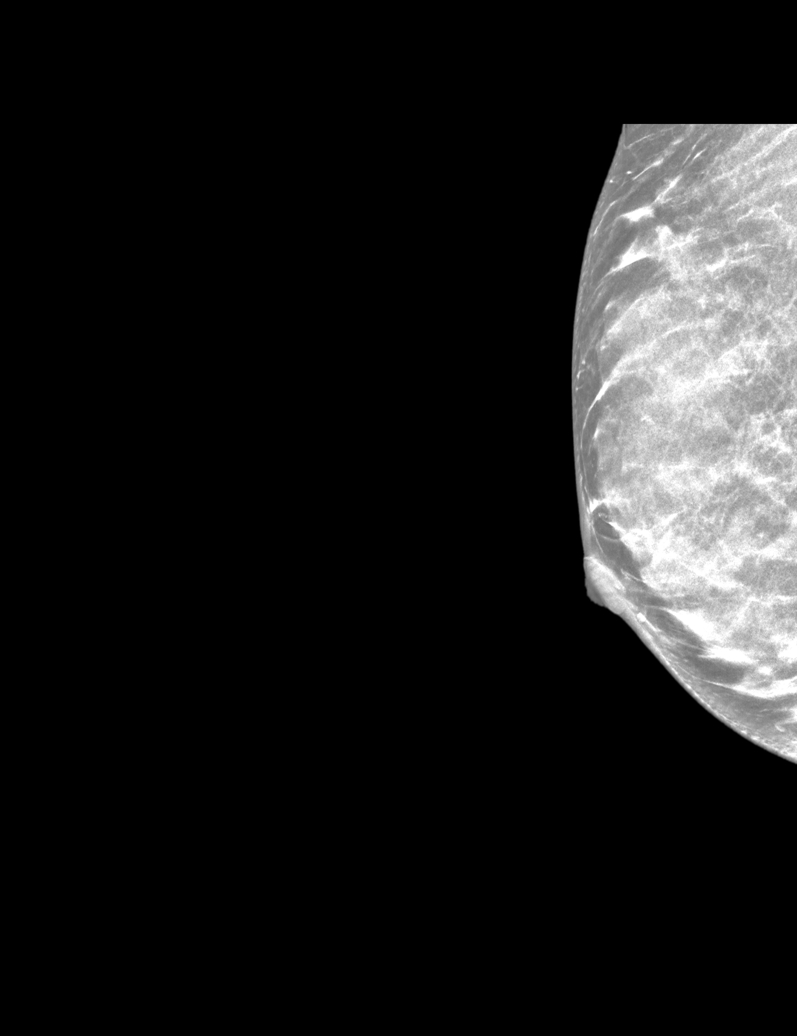

[L TAN synth-2D]
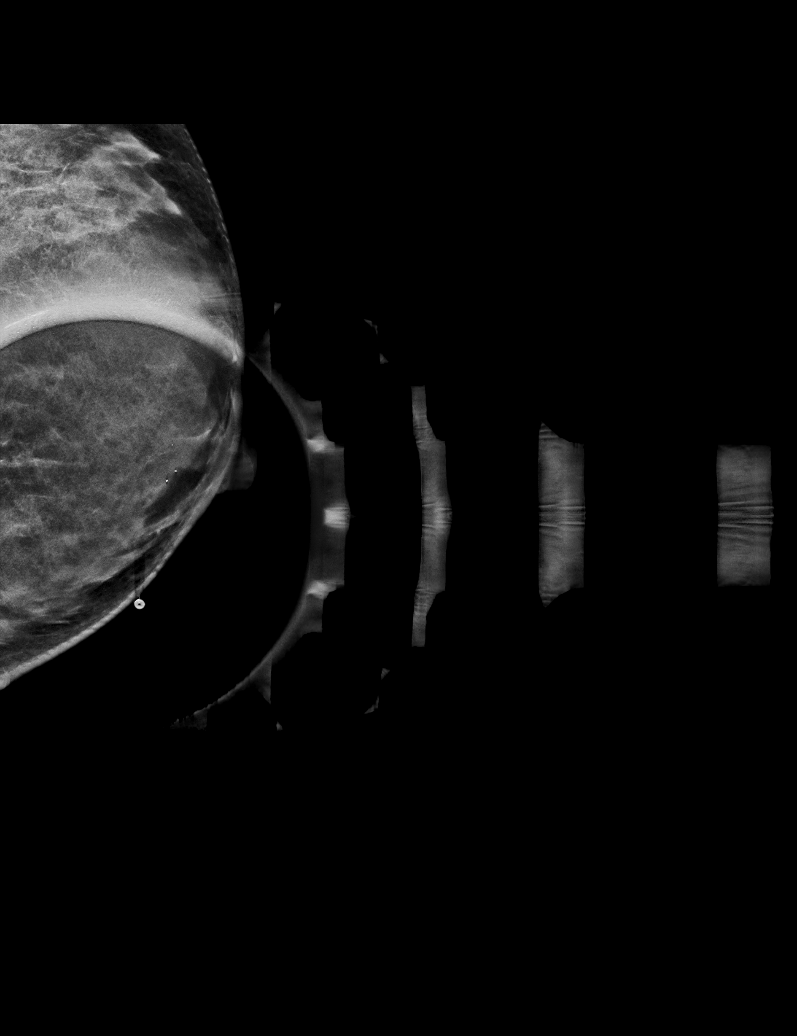

[L CC synth-2D]
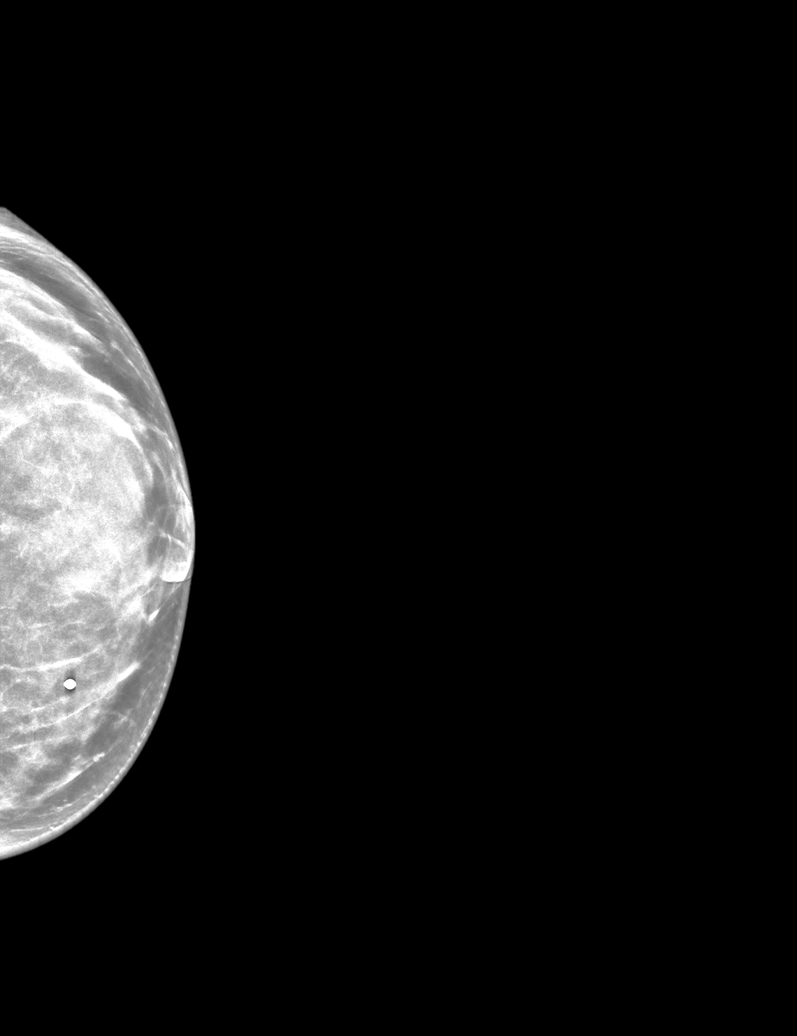

[R CC synth-2D]
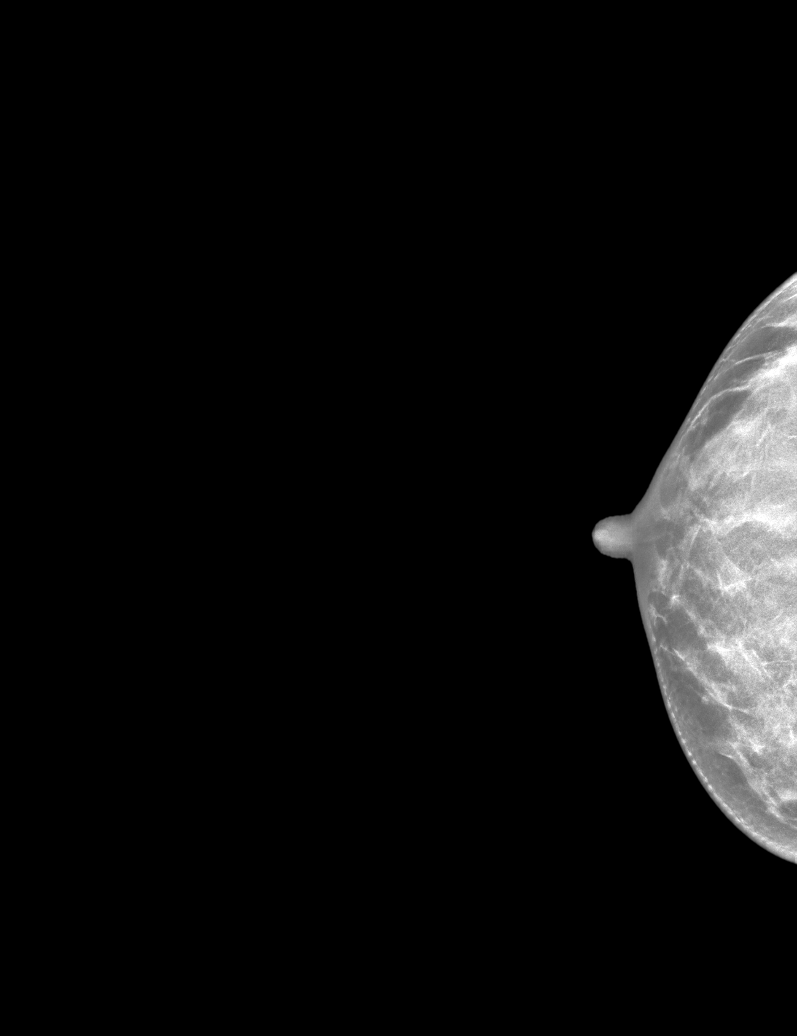

[L MLO synth-2D]
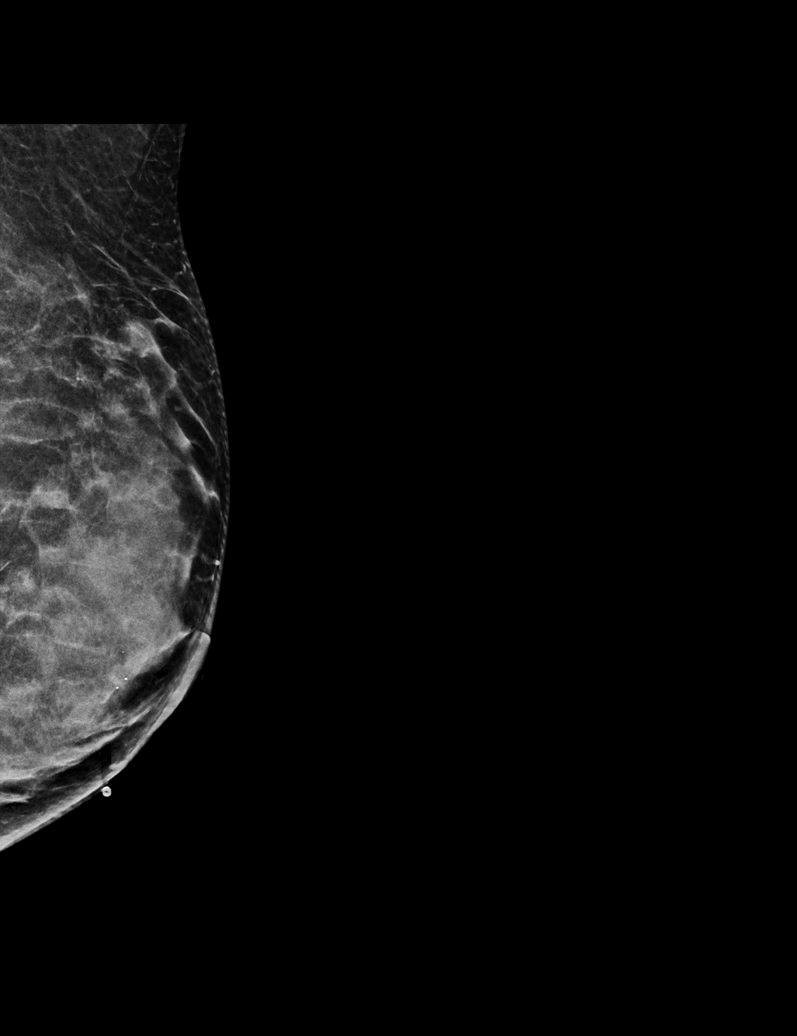

[R MLO tomo · tomo slice 23/45.0]
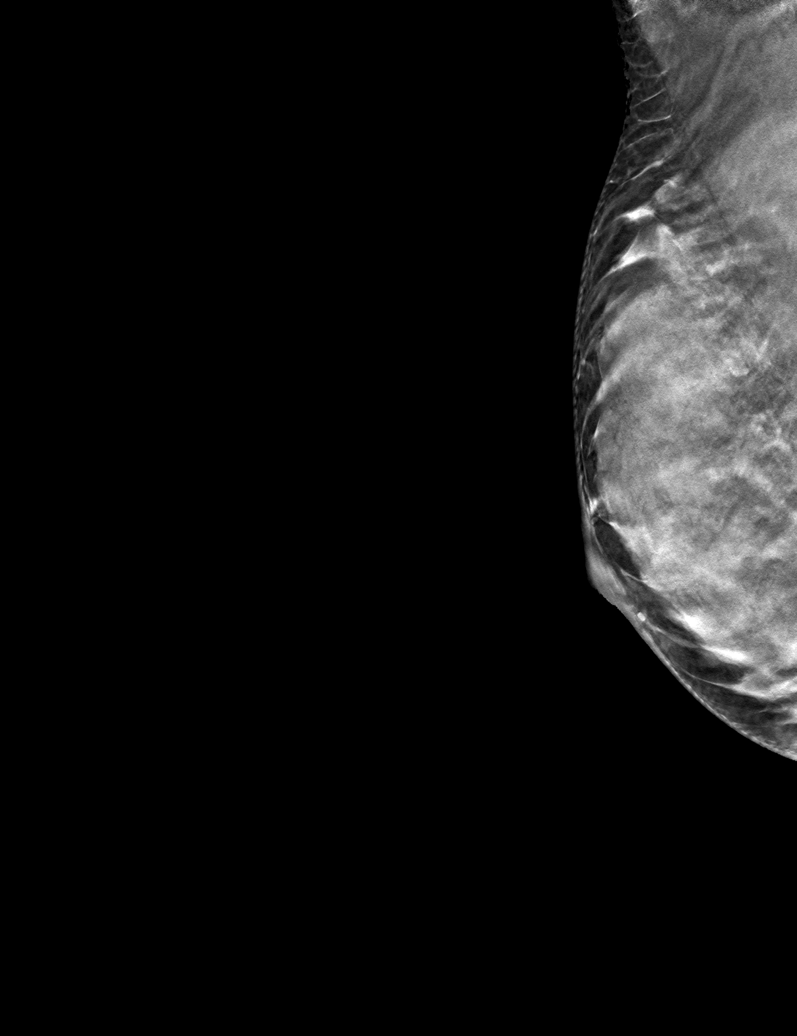

[6 of 30 positions shown; findings below may reference images not displayed]

ACR Breast Density Category d: The breast tissue is extremely dense,
which lowers the sensitivity of mammography.
FINDINGS: No radiopaque BB was placed at the site of the patient's palpable
lump in the lower inner quadrant of the left breast. No focal or
suspicious mammographic findings are seen deep to the radiopaque BB.
No suspicious findings are identified in the remainder of either
breast.

Targeted ultrasound is performed, showing dense fibroglandular
tissue without focal or suspicious sonographic abnormality.
Incidental 3 x 3 x 2 mm simple cyst is identified at the 8 o'clock
position 2 cm from the nipple.
IMPRESSION: 1. No mammographic evidence of malignancy in either breast.
2. No suspicious sonographic findings at the site of the patient's
left breast palpable lump. Note is made of an incidental simple cyst
in the vicinity.

RECOMMENDATION:
1. Clinical follow-up recommended for the palpable area of concern
in the left breast. Any further workup should be based on clinical
grounds.
2. Screening mammogram at age 38 unless there are persistent or
intervening clinical concerns. (Code:Q6-N-LWP)

I have discussed the findings and recommendations with the patient.
If applicable, a reminder letter will be sent to the patient
regarding the next appointment.

BI-RADS CATEGORY  2: Benign.

## 2022-09-20 DIAGNOSIS — D485 Neoplasm of uncertain behavior of skin: Secondary | ICD-10-CM | POA: Diagnosis not present

## 2023-08-01 ENCOUNTER — Ambulatory Visit: Payer: BC Managed Care – PPO | Admitting: Obstetrics and Gynecology

## 2023-09-07 ENCOUNTER — Ambulatory Visit (INDEPENDENT_AMBULATORY_CARE_PROVIDER_SITE_OTHER): Payer: BC Managed Care – PPO | Admitting: Radiology

## 2023-09-07 ENCOUNTER — Encounter: Payer: Self-pay | Admitting: Radiology

## 2023-09-07 VITALS — BP 114/62

## 2023-09-07 DIAGNOSIS — R300 Dysuria: Secondary | ICD-10-CM

## 2023-09-07 DIAGNOSIS — N760 Acute vaginitis: Secondary | ICD-10-CM

## 2023-09-07 DIAGNOSIS — N926 Irregular menstruation, unspecified: Secondary | ICD-10-CM | POA: Diagnosis not present

## 2023-09-07 DIAGNOSIS — B3731 Acute candidiasis of vulva and vagina: Secondary | ICD-10-CM

## 2023-09-07 DIAGNOSIS — N898 Other specified noninflammatory disorders of vagina: Secondary | ICD-10-CM

## 2023-09-07 LAB — WET PREP FOR TRICH, YEAST, CLUE

## 2023-09-07 MED ORDER — FLUCONAZOLE 150 MG PO TABS: 150.0000 mg | ORAL_TABLET | ORAL | 0 refills | Status: DC

## 2023-09-07 MED ORDER — METRONIDAZOLE 500 MG PO TABS: 500.0000 mg | ORAL_TABLET | Freq: Two times a day (BID) | ORAL | 0 refills | Status: DC

## 2023-09-07 NOTE — Progress Notes (Signed)
      Subjective: Bethany Campos is a 37 y.o. female who complains of vaginal itching, irritation, dysuria, no frequency, no urgency, used Vagisil last night externally. Symptoms began 2 days ago. LMP: 09/01/23, longer than normal (very light). BC: withdrawal     Review of Systems  All other systems reviewed and are negative.   Past Medical History:  Diagnosis Date   Dysmenorrhea    Elevated cholesterol    Fibroadenoma of breast 2011 - 2013 [place]     right breast 10 o'clock postion   Preeclampsia    Scoliosis    Vitamin D deficiency       Objective:  Today's Vitals   09/07/23 0944  BP: 114/62   There is no height or weight on file to calculate BMI.   -General: no acute distress -Vulva: without lesions or discharge -Vagina: curdy brown discharge present, wet prep obtained -Cervix: no lesion or discharge, no CMT -Perineum: no lesions -Uterus: Mobile, non tender -Adnexa: no masses or tenderness  Urine dipstick shows positive for RBC's.  Micro exam: 6-10 WBC's per HPF, 3-10 RBC's per HPF, and moderate+ bacteria.  Microscopic wet-mount exam shows clue cells, hyphae.   Raynelle Fanning, CMA present for exam  Assessment:/Plan:   1. Dysuria - Urinalysis,Complete w/RFL Culture  2. Irregular periods - Pregnancy, urine  3. Vaginal itching - WET PREP FOR TRICH, YEAST, CLUE +BV and yeast, rx sent for flagyl and fluconazole  Avoid intercourse until symptoms are resolved. Safe sex encouraged. Avoid the use of soaps or perfumed products in the peri area. Avoid tub baths and sitting in sweaty or wet clothing for prolonged periods of time.   Juanette Urizar B, NP 9:59 AM

## 2023-09-09 LAB — URINALYSIS, COMPLETE W/RFL CULTURE
Bilirubin Urine: NEGATIVE
Glucose, UA: NEGATIVE
Hyaline Cast: NONE SEEN /LPF
Ketones, ur: NEGATIVE
Nitrites, Initial: NEGATIVE
Protein, ur: NEGATIVE
Specific Gravity, Urine: 1.015 (ref 1.001–1.035)
pH: 7.5 (ref 5.0–8.0)

## 2023-09-09 LAB — URINE CULTURE
MICRO NUMBER:: 15494985
SPECIMEN QUALITY:: ADEQUATE

## 2023-09-09 LAB — PREGNANCY, URINE: Preg Test, Ur: NEGATIVE

## 2023-09-09 LAB — CULTURE INDICATED

## 2023-09-20 ENCOUNTER — Telehealth: Payer: Self-pay

## 2023-09-20 NOTE — Telephone Encounter (Signed)
Pt LVM in triage line stating since she had been dx w/ BV and yeast at previous visit, had questions about returning to intimacy with partner.   Discussed recommendations per JC's OV notes. Pt notified and voiced understanding. However, stated that she did have intercourse with partner prior to finishing abx but is now wondering if she should now wait an abstain until she finds out if he now has the bacteria and could possibly pass back to her?  Please advise.

## 2023-09-20 NOTE — Telephone Encounter (Signed)
She should be ok at this point, as long as she had a few days of antibiotics in her system. If symptoms return she is welcome to come back to be rechecked.

## 2023-09-20 NOTE — Telephone Encounter (Signed)
LDVM on machine per DPR and advised pt to cb with any additional questions/concerns.

## 2023-10-03 ENCOUNTER — Ambulatory Visit (INDEPENDENT_AMBULATORY_CARE_PROVIDER_SITE_OTHER): Payer: BC Managed Care – PPO | Admitting: Obstetrics and Gynecology

## 2023-10-03 ENCOUNTER — Other Ambulatory Visit (HOSPITAL_COMMUNITY)
Admission: RE | Admit: 2023-10-03 | Discharge: 2023-10-03 | Disposition: A | Discharge status: Home / Self Care | Payer: BC Managed Care – PPO | Source: Ambulatory Visit | Attending: Obstetrics and Gynecology | Admitting: Obstetrics and Gynecology

## 2023-10-03 ENCOUNTER — Encounter: Payer: Self-pay | Admitting: Obstetrics and Gynecology

## 2023-10-03 VITALS — BP 118/78 | HR 89 | Ht 67.32 in | Wt 138.0 lb

## 2023-10-03 DIAGNOSIS — Z01419 Encounter for gynecological examination (general) (routine) without abnormal findings: Secondary | ICD-10-CM | POA: Diagnosis not present

## 2023-10-03 DIAGNOSIS — Z1231 Encounter for screening mammogram for malignant neoplasm of breast: Secondary | ICD-10-CM

## 2023-10-03 DIAGNOSIS — N6002 Solitary cyst of left breast: Secondary | ICD-10-CM

## 2023-10-03 NOTE — Progress Notes (Signed)
37 y.o. y.o. female here for annual exam.    Patient's last menstrual period was 09/28/2023 (exact date). Period Duration (Days): 4 Menstrual Flow: Heavy Menstrual Control: Maxi pad Dysmenorrhea: (!) Mild Dysmenorrhea Symptoms: Cramping, Headache (headache occ) Periods come q month and more painful and heavy off the birth control, but she is not bothered by them.  MMG: h/o left breast cyst Pelvic discharge: denies.  Completed treatment for BV and yeast recently Pelvic pain: denies Birth control: none Last mammogram: benign Incidental 3 x 3 x 2 mm simple cyst is identified at the 8 o'clock position 2 cm from the nipple. 2022   Blood pressure 118/78, pulse 89, height 5' 7.32" (1.71 m), weight 138 lb (62.6 kg), last menstrual period 09/28/2023, SpO2 98%.     Component Value Date/Time   DIAGPAP  07/26/2022 0916    - Negative for intraepithelial lesion or malignancy (NILM)   DIAGPAP  03/13/2018 0000    NEGATIVE FOR INTRAEPITHELIAL LESIONS OR MALIGNANCY.   HPVHIGH Negative 07/26/2022 0916   ADEQPAP  07/26/2022 0916    Satisfactory for evaluation; transformation zone component PRESENT.   ADEQPAP  03/13/2018 0000    Satisfactory for evaluation  endocervical/transformation zone component PRESENT.    GYN HISTORY:    Component Value Date/Time   DIAGPAP  07/26/2022 0916    - Negative for intraepithelial lesion or malignancy (NILM)   DIAGPAP  03/13/2018 0000    NEGATIVE FOR INTRAEPITHELIAL LESIONS OR MALIGNANCY.   HPVHIGH Negative 07/26/2022 0916   ADEQPAP  07/26/2022 0916    Satisfactory for evaluation; transformation zone component PRESENT.   ADEQPAP  03/13/2018 0000    Satisfactory for evaluation  endocervical/transformation zone component PRESENT.    OB History  Gravida Para Term Preterm AB Living  2 1 1  0 1 1  SAB IAB Ectopic Multiple Live Births  1 0 0 0 1    # Outcome Date GA Lbr Len/2nd Weight Sex Type Anes PTL Lv  2 Term 11/04/19 [redacted]w[redacted]d  7 lb (3.175 kg) F  CS-LTranv EPI  LIV  1 SAB 09/2018            Past Medical History:  Diagnosis Date   Dysmenorrhea    Elevated cholesterol    Fibroadenoma of breast 2011 - 2013 [place]     right breast 10 o'clock postion   Preeclampsia    Scoliosis    Vitamin D deficiency     Past Surgical History:  Procedure Laterality Date   CESAREAN SECTION N/A 11/04/2019   Procedure: CESAREAN SECTION;  Surgeon: Maxie Better, MD;  Location: [facility];  Service: Obstetrics;  Laterality: N/A;   MOUTH SURGERY  age 4   wisdom teeth upper age 68, lower age 69    Current Outpatient Medications on File Prior to Visit  Medication Sig Dispense Refill   calcium carbonate (TUMS - DOSED IN MG ELEMENTAL CALCIUM) 500 MG chewable tablet Chew 2 tablets by mouth as needed for indigestion or heartburn.     fluconazole (DIFLUCAN) 150 MG tablet Take 1 tablet (150 mg total) by mouth every 3 (three) days. 2 tablet 0   Magnesium 250 MG TABS Take 250 mg by mouth daily.     metroNIDAZOLE (FLAGYL) 500 MG tablet Take 1 tablet (500 mg total) by mouth 2 (two) times daily. 14 tablet 0   Multiple Vitamin (MULTIVITAMIN) capsule Take 1 capsule by mouth daily.     No current facility-administered medications on file prior to visit.  Social History   Socioeconomic History   Marital status: Married    Spouse name: Not on file   Number of children: Not on file   Years of education: Not on file   Highest education level: Not on file  Occupational History   Not on file  Tobacco Use   Smoking status: Former    Types: Cigarettes    Passive exposure: Never   Smokeless tobacco: Never  Vaping Use   Vaping status: Never Used  Substance and Sexual Activity   Alcohol use: No   Drug use: No   Sexual activity: Yes    Partners: Male    Birth control/protection: Coitus interruptus  Other Topics Concern   Not on file  Social History Narrative   Not on file   Social Determinants of Health   Financial Resource Strain: Not on file   Food Insecurity: Not on file  Transportation Needs: Not on file  Physical Activity: Not on file  Stress: Not on file  Social Connections: Not on file  Intimate Partner Violence: Not on file    Family History  Problem Relation Age of Onset   Parkinson's disease Mother    Diabetes Father    Breast cancer Paternal Aunt 45       bilateral mastectomy - BRCA test unknown   Heart failure Paternal Grandfather      No Known Allergies    Patient's last menstrual period was Patient's last menstrual period was 09/28/2023 (exact date)..             Review of Systems Alls systems reviewed and are negative.     Physical Exam Constitutional:      Appearance: Normal appearance.  Genitourinary:     Vulva and urethral meatus normal.     No lesions in the vagina.     Right Labia: No rash, lesions or skin changes.    Left Labia: No lesions, skin changes or rash.    Vaginal bleeding present.     No vaginal discharge or tenderness.     No vaginal prolapse present.    No vaginal atrophy present.     Right Adnexa: not tender, not palpable and no mass present.    Left Adnexa: not tender, not palpable and no mass present.    No cervical motion tenderness or discharge.     Uterus is not enlarged, tender or irregular.     Uterus is anteverted.  Breasts:    Right: Normal.     Left: Normal.  HENT:     Head: Normocephalic.  Neck:     Thyroid: No thyroid mass, thyromegaly or thyroid tenderness.  Cardiovascular:     Rate and Rhythm: Normal rate and regular rhythm.     Heart sounds: Normal heart sounds, S1 normal and S2 normal.  Pulmonary:     Effort: Pulmonary effort is normal.     Breath sounds: Normal breath sounds and air entry.  Abdominal:     General: There is no distension.     Palpations: Abdomen is soft. There is no mass.     Tenderness: There is no abdominal tenderness. There is no guarding or rebound.  Musculoskeletal:        General: Normal range of motion.     Cervical  back: Full passive range of motion without pain, normal range of motion and neck supple. No tenderness.     Right lower leg: No edema.     Left lower leg: No edema.  Neurological:     Mental Status: She is alert.  Skin:    General: Skin is warm.  Psychiatric:        Mood and Affect: Mood normal.        Behavior: Behavior normal.        Thought Content: Thought content normal.  Vitals and nursing note reviewed. Exam conducted with a chaperone present.       A:         Well Woman GYN exam                             P:        Pap smear collected today Encouraged annual mammogram screening Labs - see orders Discussed breast self exams Encouraged healthy lifestyle practices   No follow-ups on file.  Earley Favor

## 2023-10-04 LAB — CYTOLOGY - PAP
Comment: NEGATIVE
Diagnosis: NEGATIVE
High risk HPV: NEGATIVE

## 2023-10-06 LAB — COMPREHENSIVE METABOLIC PANEL
AG Ratio: 2.1 (calc) (ref 1.0–2.5)
ALT: 24 U/L (ref 6–29)
AST: 22 U/L (ref 10–30)
Albumin: 4.6 g/dL (ref 3.6–5.1)
Alkaline phosphatase (APISO): 60 U/L (ref 31–125)
BUN: 15 mg/dL (ref 7–25)
CO2: 28 mmol/L (ref 20–32)
Calcium: 9.6 mg/dL (ref 8.6–10.2)
Chloride: 103 mmol/L (ref 98–110)
Creat: 0.7 mg/dL (ref 0.50–0.97)
Globulin: 2.2 g/dL (ref 1.9–3.7)
Glucose, Bld: 70 mg/dL (ref 65–99)
Potassium: 4.3 mmol/L (ref 3.5–5.3)
Sodium: 140 mmol/L (ref 135–146)
Total Bilirubin: 0.4 mg/dL (ref 0.2–1.2)
Total Protein: 6.8 g/dL (ref 6.1–8.1)

## 2023-10-06 LAB — HEMOGLOBIN A1C
Hgb A1c MFr Bld: 5.3 %{Hb} (ref <5.7)
Mean Plasma Glucose: 105 mg/dL
eAG (mmol/L): 5.8 mmol/L

## 2023-10-06 LAB — LIPID PANEL
Cholesterol: 224 mg/dL — ABNORMAL HIGH (ref <200)
HDL: 88 mg/dL (ref >=50)
LDL Cholesterol (Calc): 122 mg/dL — ABNORMAL HIGH
Non-HDL Cholesterol (Calc): 136 mg/dL — ABNORMAL HIGH (ref <130)
Total CHOL/HDL Ratio: 2.5 (calc) (ref <5.0)
Triglycerides: 52 mg/dL (ref <150)

## 2023-10-06 LAB — CBC
HCT: 44 % (ref 35.0–45.0)
Hemoglobin: 14.4 g/dL (ref 11.7–15.5)
MCH: 29.4 pg (ref 27.0–33.0)
MCHC: 32.7 g/dL (ref 32.0–36.0)
MCV: 90 fL (ref 80.0–100.0)
MPV: 10.4 fL (ref 7.5–12.5)
Platelets: 295 10*3/uL (ref 140–400)
RBC: 4.89 10*6/uL (ref 3.80–5.10)
RDW: 12.6 % (ref 11.0–15.0)
WBC: 5.7 10*3/uL (ref 3.8–10.8)

## 2023-10-06 LAB — VITAMIN D 1,25 DIHYDROXY
Vitamin D 1, 25 (OH)2 Total: 18 pg/mL (ref 18–72)
Vitamin D2 1, 25 (OH)2: <8 pg/mL
Vitamin D3 1, 25 (OH)2: 18 pg/mL

## 2023-10-06 LAB — TSH: TSH: 1.5 m[IU]/L

## 2023-10-16 ENCOUNTER — Ambulatory Visit (INDEPENDENT_AMBULATORY_CARE_PROVIDER_SITE_OTHER): Payer: BC Managed Care – PPO | Admitting: Radiology

## 2023-10-16 ENCOUNTER — Encounter: Payer: Self-pay | Admitting: Radiology

## 2023-10-16 VITALS — BP 116/78

## 2023-10-16 DIAGNOSIS — R300 Dysuria: Secondary | ICD-10-CM | POA: Diagnosis not present

## 2023-10-16 DIAGNOSIS — B3731 Acute candidiasis of vulva and vagina: Secondary | ICD-10-CM

## 2023-10-16 DIAGNOSIS — N898 Other specified noninflammatory disorders of vagina: Secondary | ICD-10-CM

## 2023-10-16 LAB — WET PREP FOR TRICH, YEAST, CLUE

## 2023-10-16 MED ORDER — FLUCONAZOLE 150 MG PO TABS: 150.0000 mg | ORAL_TABLET | ORAL | 0 refills | Status: DC

## 2023-10-16 NOTE — Progress Notes (Signed)
      Subjective: Bethany Campos is a 37 y.o. female who complains of slight vaginal itching, white vaginal discharge, no abnormal odor, slight dysuria, no frequency, no urgency. Symptoms began 4 days ago.   Review of Systems  All other systems reviewed and are negative.   Past Medical History:  Diagnosis Date   Dysmenorrhea    Elevated cholesterol    Fibroadenoma of breast 2011 - 2013 [place]     right breast 10 o'clock postion   Preeclampsia    Scoliosis    Vitamin D deficiency       Objective:  Today's Vitals   10/16/23 1409  BP: 116/78   There is no height or weight on file to calculate BMI.   Physical Exam Vitals and nursing note reviewed. Exam conducted with a chaperone present.  Constitutional:      Appearance: Normal appearance. She is well-developed.  Pulmonary:     Effort: Pulmonary effort is normal.  Abdominal:     General: Abdomen is flat.     Palpations: Abdomen is soft.  Genitourinary:    General: Normal vulva.     Vagina: Vaginal discharge present. No erythema, bleeding or lesions.     Cervix: Normal. No discharge, friability, lesion or erythema.     Uterus: Normal.      Adnexa: Right adnexa normal and left adnexa normal.  Neurological:     Mental Status: She is alert.  Psychiatric:        Mood and Affect: Mood normal.        Thought Content: Thought content normal.        Judgment: Judgment normal.     Microscopic wet-mount exam shows hyphae.   Raynelle Fanning, CMA present for exam  Assessment:/Plan:   1. Dysuria - Urinalysis,Complete w/RFL Culture  2. Vaginal itching - WET PREP FOR TRICH, YEAST, CLUE  + yeast- rx sent for fluconazole  Avoid perfumed bath products, dove white bar soap only Avoid intercourse until symptoms are resolved. Safe sex encouraged. Avoid the use of soaps or perfumed products in the peri area. Avoid tub baths and sitting in sweaty or wet clothing for prolonged periods of time.   Return if symptoms worsen or fail to  improve.   Paras Kreider B, NP 2:18 PM

## 2023-10-17 ENCOUNTER — Ambulatory Visit: Payer: BC Managed Care – PPO | Admitting: Radiology

## 2023-10-17 LAB — URINALYSIS, COMPLETE W/RFL CULTURE
Bilirubin Urine: NEGATIVE
Glucose, UA: NEGATIVE
Hyaline Cast: NONE SEEN /[LPF]
Ketones, ur: NEGATIVE
Nitrites, Initial: NEGATIVE
Protein, ur: NEGATIVE
Specific Gravity, Urine: 1.01 (ref 1.001–1.035)
pH: 7 (ref 5.0–8.0)

## 2023-10-17 LAB — URINE CULTURE
MICRO NUMBER:: 15657608
Result:: NO GROWTH
SPECIMEN QUALITY:: ADEQUATE

## 2023-10-17 LAB — CULTURE INDICATED

## 2024-02-05 DIAGNOSIS — R051 Acute cough: Secondary | ICD-10-CM | POA: Diagnosis not present

## 2024-03-12 DIAGNOSIS — M25531 Pain in right wrist: Secondary | ICD-10-CM | POA: Diagnosis not present

## 2024-03-12 DIAGNOSIS — M25512 Pain in left shoulder: Secondary | ICD-10-CM | POA: Diagnosis not present

## 2024-03-12 DIAGNOSIS — M25532 Pain in left wrist: Secondary | ICD-10-CM | POA: Diagnosis not present

## 2024-03-12 DIAGNOSIS — M25511 Pain in right shoulder: Secondary | ICD-10-CM | POA: Diagnosis not present

## 2024-04-06 DIAGNOSIS — H6121 Impacted cerumen, right ear: Secondary | ICD-10-CM | POA: Diagnosis not present

## 2024-06-09 DIAGNOSIS — S0185XA Open bite of other part of head, initial encounter: Secondary | ICD-10-CM | POA: Diagnosis not present

## 2024-06-09 DIAGNOSIS — W5501XA Bitten by cat, initial encounter: Secondary | ICD-10-CM | POA: Diagnosis not present

## 2024-06-11 ENCOUNTER — Ambulatory Visit
Admission: RE | Admit: 2024-06-11 | Discharge: 2024-06-11 | Disposition: A | Discharge status: Home / Self Care | Source: Ambulatory Visit | Attending: Obstetrics and Gynecology | Admitting: Obstetrics and Gynecology

## 2024-06-11 ENCOUNTER — Other Ambulatory Visit: Payer: Self-pay | Admitting: Obstetrics and Gynecology

## 2024-06-11 DIAGNOSIS — N63 Unspecified lump in unspecified breast: Secondary | ICD-10-CM

## 2024-06-11 DIAGNOSIS — Z01419 Encounter for gynecological examination (general) (routine) without abnormal findings: Secondary | ICD-10-CM

## 2024-06-11 DIAGNOSIS — Z1231 Encounter for screening mammogram for malignant neoplasm of breast: Secondary | ICD-10-CM

## 2024-06-11 DIAGNOSIS — N6002 Solitary cyst of left breast: Secondary | ICD-10-CM

## 2024-06-17 ENCOUNTER — Other Ambulatory Visit: Payer: Self-pay | Admitting: Obstetrics and Gynecology

## 2024-06-17 ENCOUNTER — Other Ambulatory Visit

## 2024-06-17 ENCOUNTER — Ambulatory Visit
Admission: RE | Admit: 2024-06-17 | Discharge: 2024-06-17 | Disposition: A | Discharge status: Home / Self Care | Source: Ambulatory Visit | Attending: Obstetrics and Gynecology

## 2024-06-17 ENCOUNTER — Ambulatory Visit
Admission: RE | Admit: 2024-06-17 | Discharge: 2024-06-17 | Disposition: A | Discharge status: Home / Self Care | Source: Ambulatory Visit | Attending: Obstetrics and Gynecology | Admitting: Obstetrics and Gynecology

## 2024-06-17 ENCOUNTER — Ambulatory Visit: Payer: Self-pay | Admitting: Obstetrics and Gynecology

## 2024-06-17 ENCOUNTER — Encounter

## 2024-06-17 DIAGNOSIS — N63 Unspecified lump in unspecified breast: Secondary | ICD-10-CM

## 2024-06-17 DIAGNOSIS — N6315 Unspecified lump in the right breast, overlapping quadrants: Secondary | ICD-10-CM | POA: Diagnosis not present

## 2024-06-17 DIAGNOSIS — N6310 Unspecified lump in the right breast, unspecified quadrant: Secondary | ICD-10-CM

## 2024-06-18 ENCOUNTER — Ambulatory Visit
Admission: RE | Admit: 2024-06-18 | Discharge: 2024-06-18 | Disposition: A | Discharge status: Home / Self Care | Source: Ambulatory Visit | Attending: Obstetrics and Gynecology | Admitting: Obstetrics and Gynecology

## 2024-06-18 DIAGNOSIS — N6310 Unspecified lump in the right breast, unspecified quadrant: Secondary | ICD-10-CM

## 2024-06-18 DIAGNOSIS — D241 Benign neoplasm of right breast: Secondary | ICD-10-CM | POA: Diagnosis not present

## 2024-06-18 DIAGNOSIS — N6315 Unspecified lump in the right breast, overlapping quadrants: Secondary | ICD-10-CM | POA: Diagnosis not present

## 2024-06-19 ENCOUNTER — Ambulatory Visit: Payer: Self-pay | Admitting: Obstetrics and Gynecology

## 2024-06-19 LAB — SURGICAL PATHOLOGY

## 2024-06-24 ENCOUNTER — Telehealth: Payer: Self-pay

## 2024-06-24 NOTE — Telephone Encounter (Signed)
 Patient called & left message on triage voicemail stating she was seen for right breast lump & will need to have a breast biopsy. She now feels a lump in her left breast & wants to know if she needs to come in for office visit

## 2024-06-25 ENCOUNTER — Encounter: Payer: Self-pay | Admitting: Obstetrics and Gynecology

## 2024-06-25 DIAGNOSIS — N6320 Unspecified lump in the left breast, unspecified quadrant: Secondary | ICD-10-CM

## 2024-06-25 NOTE — Telephone Encounter (Signed)
 Dr. Glennon -please review telephone encounter sent today.

## 2024-06-25 NOTE — Telephone Encounter (Signed)
 Spoke with patient, reports left breast lump on SBE . Denies pain, skin changes or nipple discharge. OV scheduled for 06/26/24 at 1415 with Dr. Glennon.   Results from Right breast imaging and Bx in EPIC.   Advised I will have Dr. Glennon review since Bilateral Dx MMG recently completed. Will return call if any additional recommendations. Patient agreeable.   Bilateral Dx MMG and Right breast US  completed on 06/17/24; Bx on 06/18/24.   Dr. Glennon -please review.

## 2024-06-25 NOTE — Telephone Encounter (Signed)
Ok to cancel OV

## 2024-06-25 NOTE — Telephone Encounter (Signed)
 See 06/24/24 Telephone encounter.   Routing FYI  Encounter closed.

## 2024-06-26 ENCOUNTER — Ambulatory Visit: Admitting: Obstetrics and Gynecology

## 2024-06-26 NOTE — Addendum Note (Signed)
 Addended by: BRUTUS KATE SAILOR on: 06/26/2024 07:56 AM   Modules accepted: Orders

## 2024-06-26 NOTE — Telephone Encounter (Signed)
 Per review of EPIC, Dx IMG scheduled 07/01/24.

## 2024-06-26 NOTE — Telephone Encounter (Signed)
 See 06/25/24 MyChart message.   Encounter closed.

## 2024-07-01 ENCOUNTER — Ambulatory Visit
Admission: RE | Admit: 2024-07-01 | Discharge: 2024-07-01 | Disposition: A | Discharge status: Home / Self Care | Source: Ambulatory Visit | Attending: Obstetrics and Gynecology | Admitting: Obstetrics and Gynecology

## 2024-07-01 ENCOUNTER — Ambulatory Visit
Admission: RE | Admit: 2024-07-01 | Discharge: 2024-07-01 | Disposition: A | Discharge status: Home / Self Care | Source: Ambulatory Visit | Attending: Obstetrics and Gynecology

## 2024-07-01 ENCOUNTER — Ambulatory Visit: Payer: Self-pay | Admitting: Obstetrics and Gynecology

## 2024-07-01 DIAGNOSIS — N6320 Unspecified lump in the left breast, unspecified quadrant: Secondary | ICD-10-CM

## 2024-07-01 DIAGNOSIS — R928 Other abnormal and inconclusive findings on diagnostic imaging of breast: Secondary | ICD-10-CM | POA: Diagnosis not present

## 2024-07-24 DIAGNOSIS — K625 Hemorrhage of anus and rectum: Secondary | ICD-10-CM | POA: Diagnosis not present

## 2024-07-24 DIAGNOSIS — K5900 Constipation, unspecified: Secondary | ICD-10-CM | POA: Diagnosis not present

## 2024-07-24 DIAGNOSIS — K645 Perianal venous thrombosis: Secondary | ICD-10-CM | POA: Diagnosis not present

## 2024-07-24 DIAGNOSIS — Z1211 Encounter for screening for malignant neoplasm of colon: Secondary | ICD-10-CM | POA: Diagnosis not present

## 2024-10-03 ENCOUNTER — Ambulatory Visit (INDEPENDENT_AMBULATORY_CARE_PROVIDER_SITE_OTHER): Payer: BC Managed Care – PPO | Admitting: Obstetrics and Gynecology

## 2024-10-03 ENCOUNTER — Encounter: Payer: Self-pay | Admitting: Obstetrics and Gynecology

## 2024-10-03 VITALS — BP 120/78 | HR 93 | Ht 66.93 in | Wt 130.0 lb

## 2024-10-03 DIAGNOSIS — Z01419 Encounter for gynecological examination (general) (routine) without abnormal findings: Secondary | ICD-10-CM

## 2024-10-03 DIAGNOSIS — Z1331 Encounter for screening for depression: Secondary | ICD-10-CM | POA: Diagnosis not present

## 2024-10-03 NOTE — Progress Notes (Signed)
 38 y.o. y.o. female here for annual exam. Patient's last menstrual period was 09/12/2024 (within days). Period Cycle (Days): 25 Period Duration (Days): 3 Period Pattern: Regular Menstrual Flow: Moderate, Heavy, Light (heavy the 1st day then goes to light) Menstrual Control: Maxi pad Dysmenorrhea: (!) Mild Dysmenorrhea Symptoms: Cramping, Headache Not bothered by cycles. Married has 52 year old daughter who loves dance and swimming.  Traveling to [place]  tomorrow. Declines flu Gardesil: complete MMG: h/o left breast cyst Pelvic discharge: denies.  Completed treatment for BV and yeast recently Pelvic pain: denies Birth control: none withdrawal Last mammogram: benign Incidental 3 x 3 x 2 mm simple cyst is identified at the 8 o'clock position 2 cm from the nipple. 2022 Pap: 2024. Denies abnormals  Body mass index is 20.4 kg/m.     10/03/2024    9:24 AM  Depression screen PHQ 2/9  Decreased Interest 0  Down, Depressed, Hopeless 0  PHQ - 2 Score 0    Blood pressure 120/78, pulse 93, height 5' 6.93 (1.7 m), weight 130 lb (59 kg), last menstrual period 09/12/2024, SpO2 98%.     Component Value Date/Time   DIAGPAP  10/03/2023 0928    - Negative for intraepithelial lesion or malignancy (NILM)   DIAGPAP  07/26/2022 0916    - Negative for intraepithelial lesion or malignancy (NILM)   DIAGPAP  03/13/2018 0000    NEGATIVE FOR INTRAEPITHELIAL LESIONS OR MALIGNANCY.   HPVHIGH Negative 10/03/2023 0928   HPVHIGH Negative 07/26/2022 0916   ADEQPAP  10/03/2023 0928    Satisfactory for evaluation; transformation zone component PRESENT.   ADEQPAP  07/26/2022 0916    Satisfactory for evaluation; transformation zone component PRESENT.   ADEQPAP  03/13/2018 0000    Satisfactory for evaluation  endocervical/transformation zone component PRESENT.    GYN HISTORY:    Component Value Date/Time   DIAGPAP  10/03/2023 0928    - Negative for intraepithelial lesion or malignancy (NILM)    DIAGPAP  07/26/2022 0916    - Negative for intraepithelial lesion or malignancy (NILM)   DIAGPAP  03/13/2018 0000    NEGATIVE FOR INTRAEPITHELIAL LESIONS OR MALIGNANCY.   HPVHIGH Negative 10/03/2023 0928   HPVHIGH Negative 07/26/2022 0916   ADEQPAP  10/03/2023 0928    Satisfactory for evaluation; transformation zone component PRESENT.   ADEQPAP  07/26/2022 0916    Satisfactory for evaluation; transformation zone component PRESENT.   ADEQPAP  03/13/2018 0000    Satisfactory for evaluation  endocervical/transformation zone component PRESENT.    OB History  Gravida Para Term Preterm AB Living  2 1 1  0 1 1  SAB IAB Ectopic Multiple Live Births  1 0 0 0 1    # Outcome Date GA Lbr Len/2nd Weight Sex Type Anes PTL Lv  2 Term 11/04/19 [redacted]w[redacted]d  7 lb (3.175 kg) F CS-LTranv EPI  LIV  1 SAB 09/2018            Past Medical History:  Diagnosis Date   Dysmenorrhea    Elevated cholesterol    Fibroadenoma of breast 2011 - 2013 [place]      right breast 10 o'clock postion   Preeclampsia    Scoliosis    Vitamin D  deficiency     Past Surgical History:  Procedure Laterality Date   BREAST BIOPSY Right 06/18/2024   US  RT BREAST BX W LOC DEV 1ST LESION IMG BX SPEC US  GUIDE 06/18/2024 [facility] MAMMOGRAPHY   CESAREAN SECTION N/A 11/04/2019   Procedure: CESAREAN SECTION;  Surgeon: Rutherford Gain, MD;  Location: [facility];  Service: Obstetrics;  Laterality: N/A;   MOUTH SURGERY  age 68   wisdom teeth upper age 67, lower age 88    Current Outpatient Medications on File Prior to Visit  Medication Sig Dispense Refill   calcium carbonate (TUMS - DOSED IN MG ELEMENTAL CALCIUM) 500 MG chewable tablet Chew 2 tablets by mouth as needed for indigestion or heartburn.     Magnesium  250 MG TABS Take 250 mg by mouth daily.     Multiple Vitamin (MULTIVITAMIN) capsule Take 1 capsule by mouth daily.     No current facility-administered medications on file prior to visit.    Social History   Socioeconomic History    Marital status: Married    Spouse name: Not on file   Number of children: Not on file   Years of education: Not on file   Highest education level: Not on file  Occupational History   Not on file  Tobacco Use   Smoking status: Former    Types: Cigarettes    Passive exposure: Never   Smokeless tobacco: Never  Vaping Use   Vaping status: Never Used  Substance and Sexual Activity   Alcohol use: No   Drug use: No   Sexual activity: Yes    Partners: Male    Birth control/protection: Coitus interruptus, Inserts  Other Topics Concern   Not on file  Social History Narrative   Not on file   Social Drivers of Health   Financial Resource Strain: Not on file  Food Insecurity: Not on file  Transportation Needs: Not on file  Physical Activity: Not on file  Stress: Not on file  Social Connections: Not on file  Intimate Partner Violence: Not on file    Family History  Problem Relation Age of Onset   Parkinson's disease Mother    Diabetes Father    Breast cancer Paternal Aunt 45       bilateral mastectomy - BRCA test unknown   Heart failure Paternal Grandfather      No Known Allergies    Patient's last menstrual period was Patient's last menstrual period was 09/12/2024 (within days)..            Review of Systems Alls systems reviewed and are negative.     Physical Exam Constitutional:      Appearance: Normal appearance.  Genitourinary:     Vulva and urethral meatus normal.     No lesions in the vagina.     Right Labia: No rash, lesions or skin changes.    Left Labia: No lesions, skin changes or rash.    No vaginal discharge or tenderness.     No vaginal prolapse present.    No vaginal atrophy present.     Right Adnexa: not tender, not palpable and no mass present.    Left Adnexa: not tender, not palpable and no mass present.    No cervical motion tenderness or discharge.     Uterus is not enlarged, tender or irregular.  Breasts:    Right: Normal.     Left:  Normal.  HENT:     Head: Normocephalic.  Neck:     Thyroid : No thyroid  mass, thyromegaly or thyroid  tenderness.  Cardiovascular:     Rate and Rhythm: Normal rate and regular rhythm.     Heart sounds: Normal heart sounds, S1 normal and S2 normal.  Pulmonary:     Effort: Pulmonary effort is normal.  Breath sounds: Normal breath sounds and air entry.  Abdominal:     General: There is no distension.     Palpations: Abdomen is soft. There is no mass.     Tenderness: There is no abdominal tenderness. There is no guarding or rebound.  Musculoskeletal:        General: Normal range of motion.     Cervical back: Full passive range of motion without pain, normal range of motion and neck supple. No tenderness.     Right lower leg: No edema.     Left lower leg: No edema.  Neurological:     Mental Status: She is alert.  Skin:    General: Skin is warm.  Psychiatric:        Mood and Affect: Mood normal.        Behavior: Behavior normal.        Thought Content: Thought content normal.  Vitals and nursing note reviewed. Exam conducted with a chaperone present.       A:         Well Woman GYN exam                             P:        Pap smear not indicated Encouraged annual mammogram screening Colon cancer screening not indicated DXA not indicated Labs and immunizations to do with PMD She would like to check her cholesterol and vit D today.  Encouraged healthy lifestyle practices Encouraged Vit D daily  No follow-ups on file.  Bethany Campos

## 2024-10-04 LAB — LIPID PANEL
Cholesterol: 236 mg/dL — ABNORMAL HIGH (ref <200)
HDL: 88 mg/dL (ref >=50)
LDL Cholesterol (Calc): 131 mg/dL — ABNORMAL HIGH
Non-HDL Cholesterol (Calc): 148 mg/dL — ABNORMAL HIGH (ref <130)
Total CHOL/HDL Ratio: 2.7 (calc) (ref <5.0)
Triglycerides: 76 mg/dL (ref <150)

## 2024-10-04 LAB — VITAMIN D 25 HYDROXY (VIT D DEFICIENCY, FRACTURES): Vit D, 25-Hydroxy: 29 ng/mL — ABNORMAL LOW (ref 30–100)

## 2024-10-06 ENCOUNTER — Ambulatory Visit: Payer: Self-pay | Admitting: Obstetrics and Gynecology

## 2024-11-10 ENCOUNTER — Telehealth: Payer: Self-pay

## 2024-11-10 NOTE — Telephone Encounter (Signed)
 Patient left a message on triage voicemail complaining of discomfort at the end of urination & vaginal discomfort. I called her back and she stated she went to urgent care and was waiting on results to come back. She was appreciative of phone call.

## 2024-11-21 ENCOUNTER — Encounter: Payer: Self-pay | Admitting: Nurse Practitioner

## 2024-11-21 ENCOUNTER — Ambulatory Visit: Payer: Self-pay | Admitting: Nurse Practitioner

## 2024-11-21 VITALS — BP 110/70 | Temp 97.8°F | Resp 16

## 2024-11-21 DIAGNOSIS — N760 Acute vaginitis: Secondary | ICD-10-CM

## 2024-11-21 DIAGNOSIS — R309 Painful micturition, unspecified: Secondary | ICD-10-CM

## 2024-11-21 DIAGNOSIS — N939 Abnormal uterine and vaginal bleeding, unspecified: Secondary | ICD-10-CM

## 2024-11-21 LAB — WET PREP FOR TRICH, YEAST, CLUE

## 2024-11-21 LAB — PREGNANCY, URINE: Preg Test, Ur: NEGATIVE

## 2024-11-21 MED ORDER — FLUCONAZOLE 150 MG PO TABS: 150.0000 mg | ORAL_TABLET | ORAL | 0 refills | Status: AC

## 2024-11-21 MED ORDER — CLOBETASOL PROPIONATE 0.05 % EX OINT: 1.0000 | TOPICAL_OINTMENT | Freq: Two times a day (BID) | CUTANEOUS | 0 refills | Status: AC

## 2024-11-21 NOTE — Progress Notes (Signed)
   Acute Office Visit  Subjective:    Patient ID: Bethany Campos, female    DOB: May 06, 1986, 38 y.o.   MRN: [id number]   HPI 38 y.o. presents today for bleeding, pain at end of urination and cramping. Treated for BV 11/25 by UC with Flagyl . Felt like symptoms were getting better but then starting have discomfort with urination, so she returned to [facility] 12/2 - negative UA. LMP 11/03/2024 but started bleeding yesterday. Menses typically occur about every 21-28 days. Has been cramping and she does not typically cramp with menses. Sexually active. Uses withdrawal method but does allow internal ejaculation during menses.   Patient's last menstrual period was 11/03/2024 (exact date).    Review of Systems  Constitutional: Negative.   Genitourinary:  Positive for dysuria, pelvic pain (Cramping) and vaginal bleeding. Negative for flank pain, frequency, hematuria, urgency, vaginal discharge and vaginal pain.       Objective:    Physical Exam Constitutional:      Appearance: Normal appearance.  Genitourinary:    General: Normal vulva.     Vagina: Bleeding present.     Cervix: Normal.     BP 110/70   Temp 97.8 F (36.6 C) (Oral)   Resp 16   LMP 11/03/2024 (Exact Date)   SpO2 99%  Wt Readings from Last 3 Encounters:  10/03/24 130 lb (59 kg)  10/03/23 138 lb (62.6 kg)  07/26/22 129 lb (58.5 kg)        Zada Louder, CMA present as biomedical engineer.   Wet prep negative for pathogens   UA: unremarkable  UPT neg  Assessment & Plan:   Problem List Items Addressed This Visit   None Visit Diagnoses       Acute vaginitis    -  Primary   Relevant Medications   fluconazole  (DIFLUCAN ) 150 MG tablet   clobetasol  ointment (TEMOVATE ) 0.05 %   Other Relevant Orders   WET PREP FOR TRICH, YEAST, CLUE     Pain with urination       Relevant Orders   Urinalysis,Complete w/RFL Culture     Vaginal bleeding       Relevant Orders   Pregnancy, urine      Plan: Negative UA and wet prep.  Diflucan  provided due to recent antibiotic use and symptom timing. May have residual urethral irritation from infection. Apply clobetasol  ointment BID x  7 days. Urine culture pending. UPT neg.   Return if symptoms worsen or fail to improve.    Annabella DELENA Shutter DNP, 8:47 AM 11/21/2024

## 2024-11-23 LAB — URINE CULTURE

## 2024-11-24 ENCOUNTER — Ambulatory Visit: Payer: Self-pay | Admitting: Nurse Practitioner

## 2024-11-25 ENCOUNTER — Other Ambulatory Visit: Payer: Self-pay | Admitting: Nurse Practitioner

## 2024-11-25 DIAGNOSIS — N3000 Acute cystitis without hematuria: Secondary | ICD-10-CM

## 2024-11-25 LAB — URINE CULTURE
MICRO NUMBER:: 17320917
SPECIMEN QUALITY:: ADEQUATE

## 2024-11-25 LAB — URINALYSIS, COMPLETE W/RFL CULTURE
Bacteria, UA: NONE SEEN /HPF
Glucose, UA: NEGATIVE
Hyaline Cast: NONE SEEN /LPF
Ketones, ur: NEGATIVE
Leukocyte Esterase: NEGATIVE
Nitrites, Initial: NEGATIVE
RBC / HPF: >=60 /HPF — AB (ref 0–2)
Specific Gravity, Urine: 1.025 (ref 1.001–1.035)
WBC, UA: NONE SEEN /HPF (ref 0–5)
pH: 6 (ref 5.0–8.0)

## 2024-11-25 LAB — CULTURE INDICATED

## 2024-11-25 MED ORDER — NITROFURANTOIN MONOHYD MACRO 100 MG PO CAPS: 100.0000 mg | ORAL_CAPSULE | Freq: Two times a day (BID) | ORAL | 0 refills | Status: AC

## 2024-12-08 NOTE — Telephone Encounter (Signed)
 Routing to covering provider to review.  3rd diflucan  is a different dosage. First 2 tabs prescribed by urgent care.   Please review and advise.

## 2024-12-08 NOTE — Telephone Encounter (Signed)
 Take the 150mg  prescribed on Thursday.

## 2024-12-08 NOTE — Telephone Encounter (Signed)
 Call placed to patient to further discuss. Left message to call [facility] Triage at [phone], option 4.

## 2024-12-09 NOTE — Telephone Encounter (Signed)
 Patient to take diflucan  on Thursday. Patient made an appt for recheck on Tuesday incase her symptoms do not get better. If better she will call 24hrs before to cancel the appt.

## 2024-12-16 ENCOUNTER — Ambulatory Visit: Payer: Self-pay | Admitting: Nurse Practitioner

## 2024-12-23 ENCOUNTER — Ambulatory Visit: Payer: Self-pay | Admitting: Nurse Practitioner

## 2024-12-25 ENCOUNTER — Telehealth: Payer: Self-pay | Admitting: *Deleted

## 2024-12-25 NOTE — Telephone Encounter (Signed)
 Patient notified of recommendations.

## 2024-12-25 NOTE — Telephone Encounter (Signed)
 Patient left message on triage line at [phone].  Patient States she was seen at [facility] approximately 10mo ago, tx for BV and UTI. Symptoms if vaginal discharge occurred after recent menses, was seen in UC again on 12/22/24, tx with Flagyl  for BV. Patient requesting guidance for re-occurence and prevention, would like to further discuss. Inquiring about boric acid vaginal suppositories.

## 2024-12-25 NOTE — Telephone Encounter (Signed)
 Women's probiotic daily, can do boric acid twice weekly. Also depends on what is causing it. Intercourse, menses, soaps or detergents, etc.

## 2024-12-25 NOTE — Telephone Encounter (Signed)
 I called and spoke with patient. She is currently taking flagyl . She is wanting to know what she could do to cut down on re-occurrence of the BV. She is asking if boric acid would be good for her. Please advise.

## 2025-01-07 ENCOUNTER — Telehealth: Payer: Self-pay

## 2025-01-07 NOTE — Telephone Encounter (Signed)
 Patient called triage line and states she had BV in November and again in January. She went to urgent care on January 18th for a recheck and she still has it. She said they also tested for mycoplasma and it was negative. Patient said she needed to let us  know. She was unsure if we need to treat her or if they do. I notified her by detailed message (DPR signed) that they will need to treat her since they saw her for that visit. Routing to Sena, NP

## 2025-01-21 ENCOUNTER — Encounter: Payer: Self-pay | Admitting: Obstetrics and Gynecology

## 2025-01-21 ENCOUNTER — Ambulatory Visit: Payer: Self-pay | Admitting: Obstetrics and Gynecology

## 2025-01-21 VITALS — BP 110/70 | HR 80 | Ht 67.0 in | Wt 128.0 lb

## 2025-01-21 DIAGNOSIS — B9689 Other specified bacterial agents as the cause of diseases classified elsewhere: Secondary | ICD-10-CM

## 2025-01-21 DIAGNOSIS — N760 Acute vaginitis: Secondary | ICD-10-CM

## 2025-01-21 NOTE — Progress Notes (Signed)
" ° °  Acute Office Visit  Subjective:    Patient ID: Bethany Campos, female    DOB: 1986-04-07, 39 y.o.   MRN: [id number]   HPI 38 y.o. presents today for office visit (Still having BV symptoms and meds are not working ) . Recurrent BV Has tried clindesse, metrogel  and boric acid Reports M. Genitalium neg in urgent care. Full panel not completed No douching, soap and avoiding intercourse at this point Patient's last menstrual period was 01/10/2025 (exact date). Period Duration (Days): 4 Period Pattern: Regular Menstrual Flow: Light, Heavy Menstrual Control: Maxi pad Menstrual Control Change Freq (Hours): 2-3 Dysmenorrhea: (!) Mild Dysmenorrhea Symptoms: Cramping, Headache  Review of Systems     Objective:    OBGyn Exam  BP 110/70 (BP Location: Left Arm, Patient Position: Sitting, Cuff Size: Normal)   Pulse 80   Ht 5' 7 (1.702 m)   Wt 128 lb (58.1 kg)   LMP 01/10/2025 (Exact Date)   SpO2 99%   BMI 20.05 kg/m  Wt Readings from Last 3 Encounters:  01/21/25 128 lb (58.1 kg)  10/03/24 130 lb (59 kg)  10/03/23 138 lb (62.6 kg)   Issa, CMA was present for the exam.    SVE: normal discharge. No lesions  Assessment & Plan:  Recurrent BV  May do longer course of boric acid with recurrent symptoms ie 2 weeks if needed. Full mycoplasma culture sent. Repeat bv swab sent Almarie MARLA Carpen  "

## 2025-01-21 NOTE — Progress Notes (Signed)
 Still having BV symptoms and meds are not working Slight throbbing, no itch, no odor, clear discharge

## 2025-01-22 ENCOUNTER — Other Ambulatory Visit: Payer: Self-pay | Admitting: Obstetrics and Gynecology

## 2025-01-22 DIAGNOSIS — N946 Dysmenorrhea, unspecified: Secondary | ICD-10-CM

## 2025-01-29 ENCOUNTER — Other Ambulatory Visit: Payer: Self-pay

## 2025-01-29 ENCOUNTER — Other Ambulatory Visit: Payer: Self-pay | Admitting: Obstetrics and Gynecology

## 2025-10-07 ENCOUNTER — Ambulatory Visit: Admitting: Obstetrics and Gynecology
